# Patient Record
Sex: Male | Born: 1975 | Race: White | Hispanic: No | Marital: Married | Smoking: Never smoker
Health system: Southern US, Community
[De-identification: ages and names within clinical notes are randomized; demographics above are authoritative.]

## PROBLEM LIST (undated history)

## (undated) DIAGNOSIS — K219 Gastro-esophageal reflux disease without esophagitis: Secondary | ICD-10-CM

## (undated) DIAGNOSIS — T7840XA Allergy, unspecified, initial encounter: Secondary | ICD-10-CM

## (undated) HISTORY — PX: CHOLECYSTECTOMY: SHX55

## (undated) HISTORY — DX: Allergy, unspecified, initial encounter: T78.40XA

## (undated) HISTORY — PX: TONSILLECTOMY: SUR1361

---
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
## 2005-08-21 ENCOUNTER — Emergency Department: Payer: Self-pay | Admitting: Emergency Medicine

## 2007-06-25 ENCOUNTER — Emergency Department: Payer: Self-pay | Admitting: Emergency Medicine

## 2007-08-08 ENCOUNTER — Ambulatory Visit: Payer: Self-pay | Admitting: Specialist

## 2009-02-13 ENCOUNTER — Ambulatory Visit: Payer: Self-pay | Admitting: Family Medicine

## 2009-08-03 IMAGING — CT CT ABD-PELV W/O CM
1 of 2 series · 15 of 32 positions shown, 19 images · non-contrast
Comparison: none

REASON FOR EXAM: (1) L flank and LLQ pain; (2) L flank and LLQ pain
COMMENTS:

[Series 2: stone · axial · 0.82mm/px · z∈[-524,-56]mm · 15 of 172 slices shown, 19 images]
[im 8/172  soft-tissue]
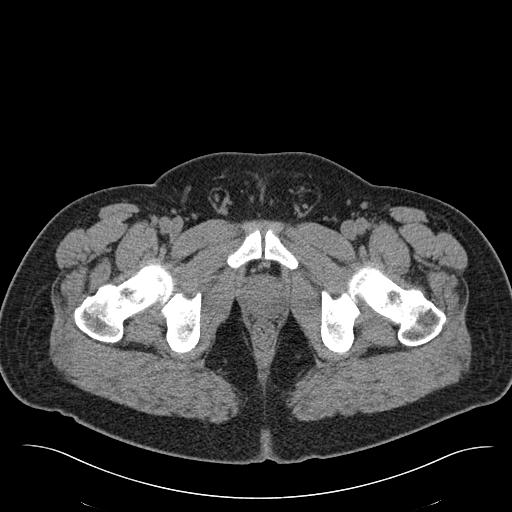
[im 8/172  bone]
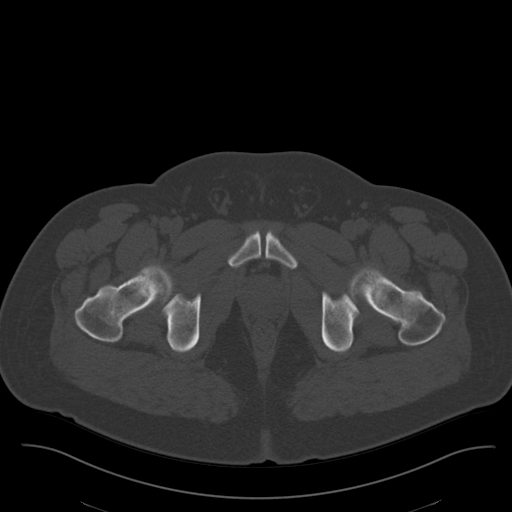
[im 23/172  soft-tissue]
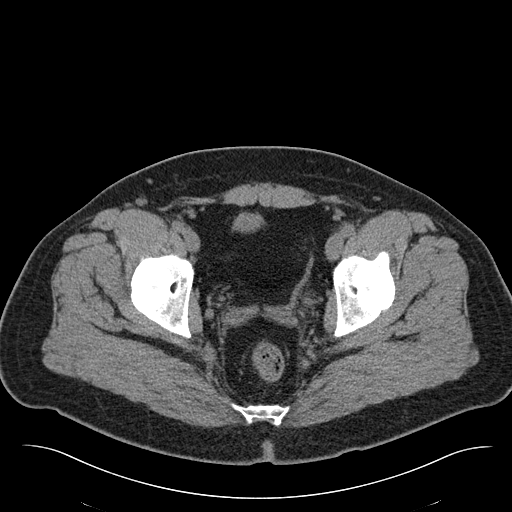
[im 38/172  soft-tissue]
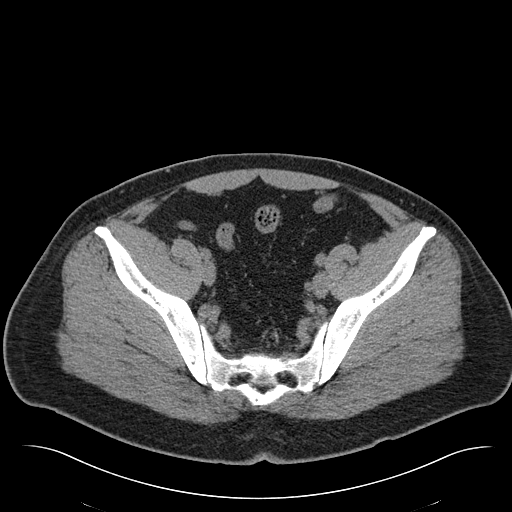
[im 45/172  soft-tissue]
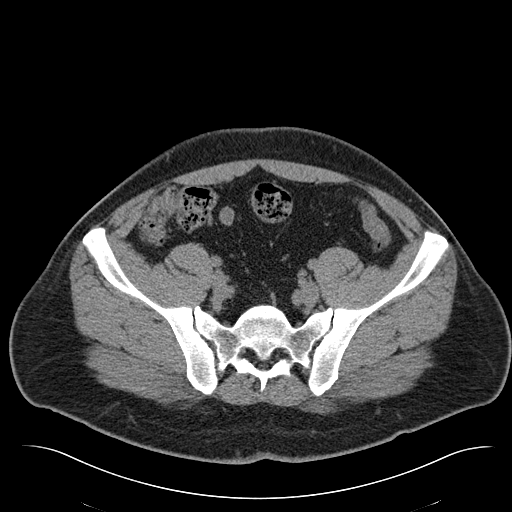
[im 60/172  soft-tissue]
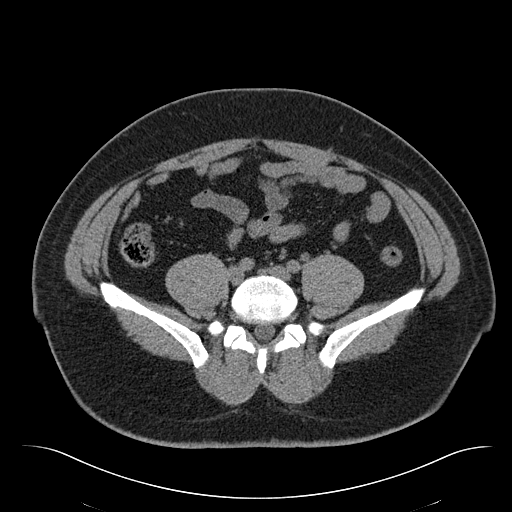
[im 75/172  soft-tissue]
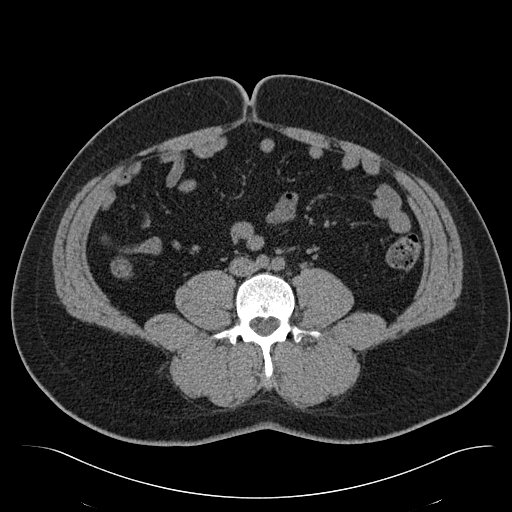
[im 90/172  soft-tissue]
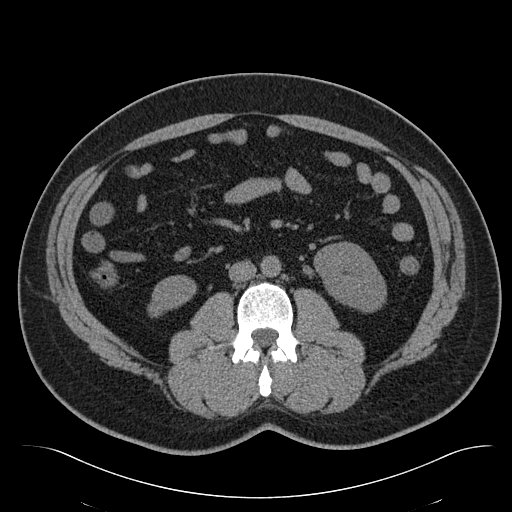
[im 97/172  soft-tissue]
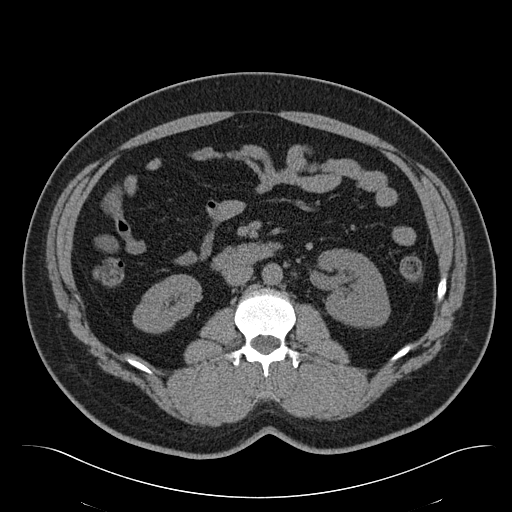
[im 112/172  soft-tissue]
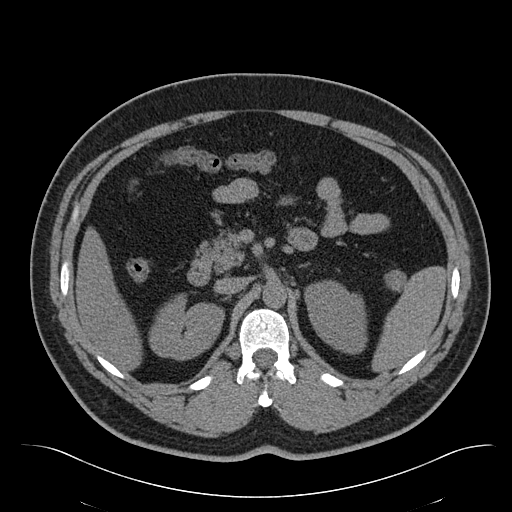
[im 112/172  bone]
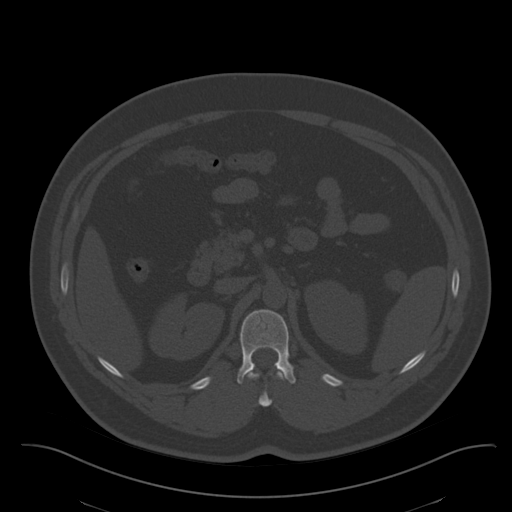
[im 127/172  soft-tissue]
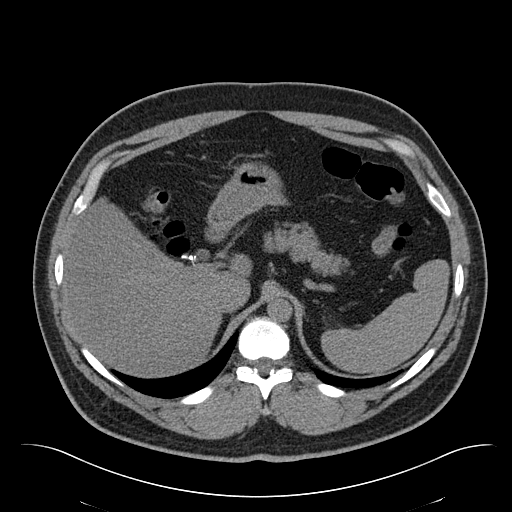
[im 134/172  soft-tissue]
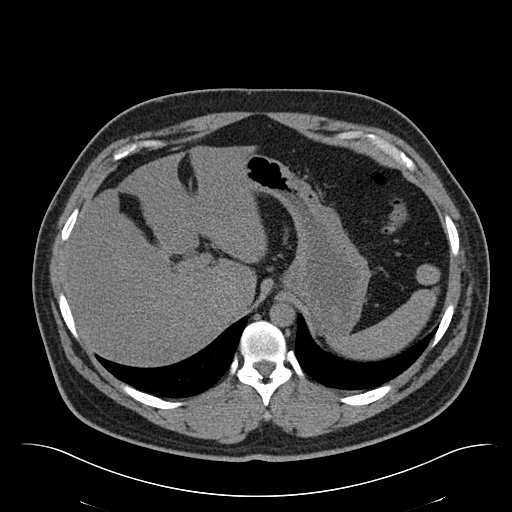
[im 142/172  lung]
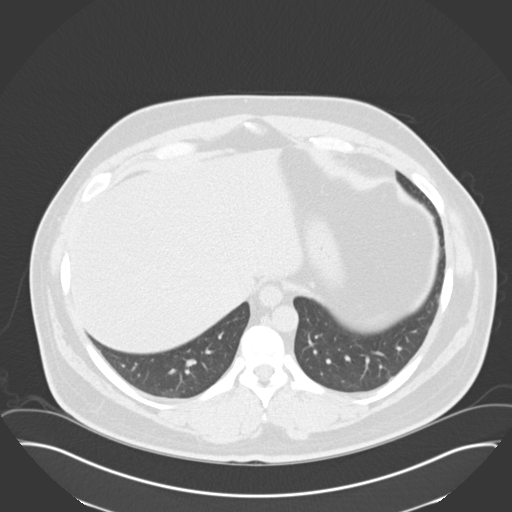
[im 149/172  soft-tissue]
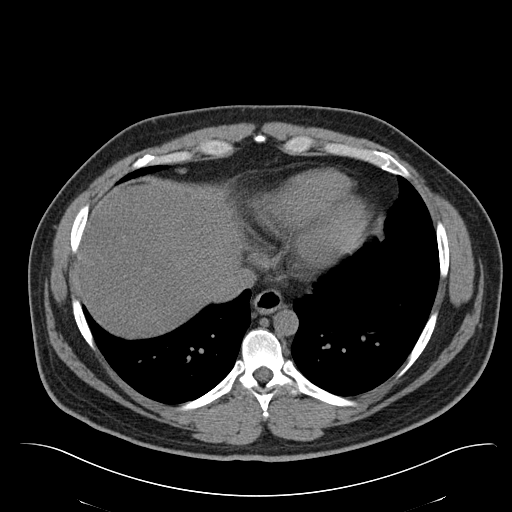
[im 149/172  lung]
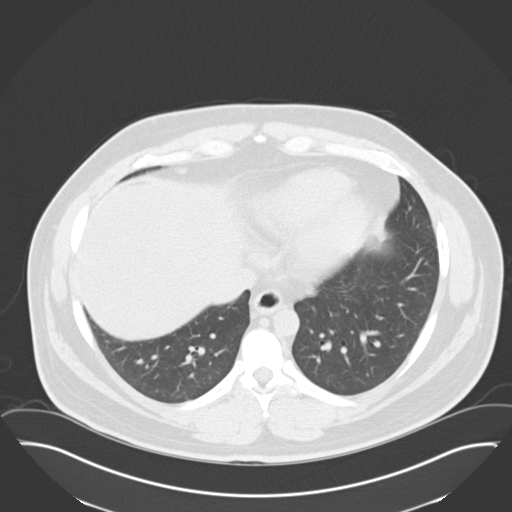
[im 157/172  lung]
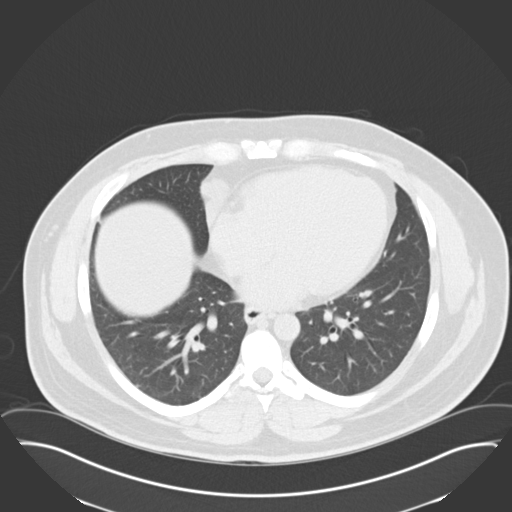
[im 164/172  soft-tissue]
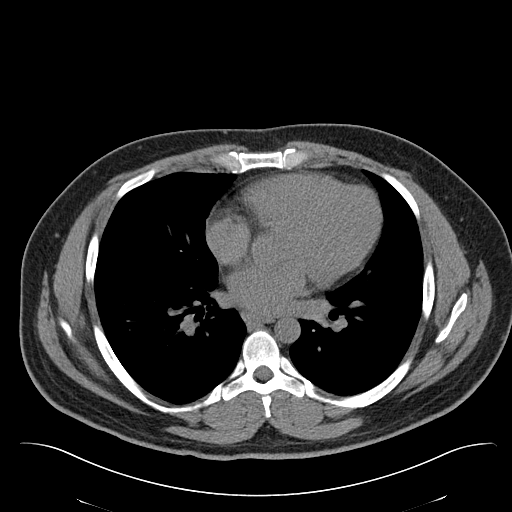
[im 164/172  lung]
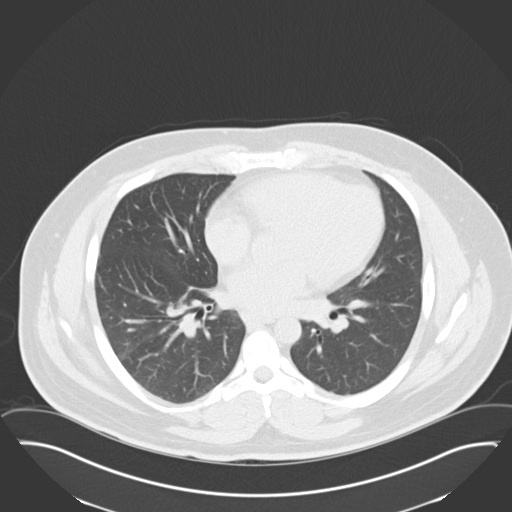

[15 of 32 positions shown; findings below may reference images not displayed]

PROCEDURE:     CT  - CT ABDOMEN AND PELVIS W[DATE]  [DATE]

RESULT:     Helical non-contrast 3 mm sections were obtained from the lung
bases through the pubic symphysis.

Evaluation of the lung bases demonstrates no gross abnormalities.

Within the limitations of a non-contrasted CT, the liver, spleen, adrenals
and pancreas are unremarkable. Evaluation of the LEFT kidney demonstrates a
1 mm fleck of calcified density projecting within the posterior
corticomedullary mid pole portion of the kidney. There is mild
pyelocaliectasis and mild hydroureter. Along the distal portion of the
ureter the hydroureter becomes moderate and there is inflammatory change
within the periureteral fat. In the region of the distal ureter just
proximal to the ureterovesical junction a 5.5 mm calculus is identified.

Evaluation of the RIGHT kidney demonstrates no evidence of hydronephrosis,
masses or calculi. The patient is status post cholecystectomy. There is no
evidence of an abdominal aortic aneurysm or abdominal or pelvic masses, free
fluid or drainable loculated fluid collections. No CT evidence is
appreciated reflecting the sequela of diverticulitis, bowel obstruction,
colitis, enteritis or appendicitis.
IMPRESSION: 1)Distal LEFT ureteral calculus measuring 5.5 mm in diameter with associated
mild obstructive uropathy.

2)Dr. Marinduque of the Emergency Room was informed of these findings at the
time of the initial interpretation.

## 2009-09-16 IMAGING — CT CT ABD-PELV W/O CM
1 of 2 series · 15 of 32 positions shown, 19 images · non-contrast
Comparison: none

REASON FOR EXAM: kidney stones
COMMENTS:

PROCEDURE:     CT  - CT ABDOMEN AND PELVIS W[DATE] [DATE]
RESULT:     Comparison: Renal stone CT on 06/25/2007.
TECHNIQUE: CT examination of the abdomen and pelvis was performed without
contrast. Collimation is 3 mm.

[Series 2: stone · axial · 0.86mm/px · z∈[-231,+231]mm · 15 of 169 slices shown, 19 images]
[im 8/169  soft-tissue]
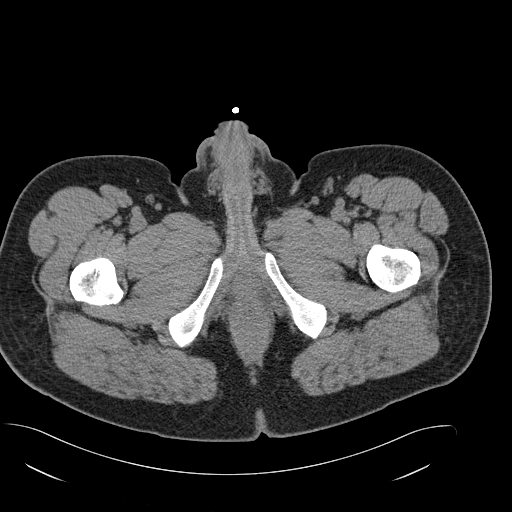
[im 8/169  bone]
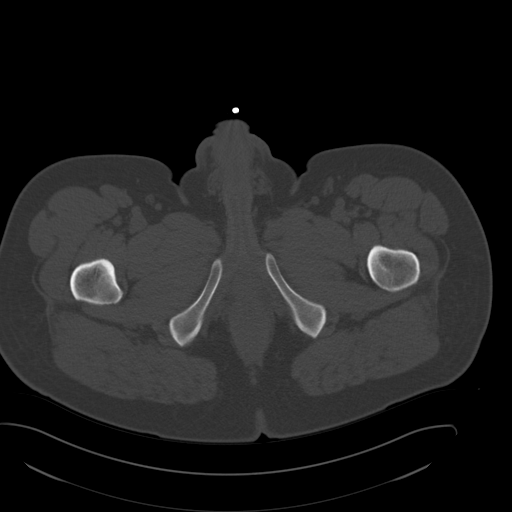
[im 22/169  soft-tissue]
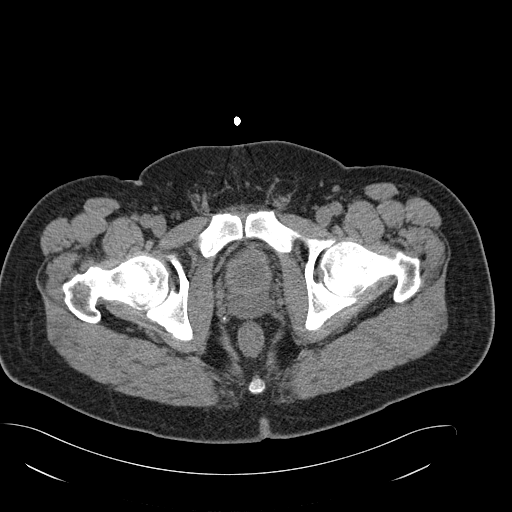
[im 36/169  soft-tissue]
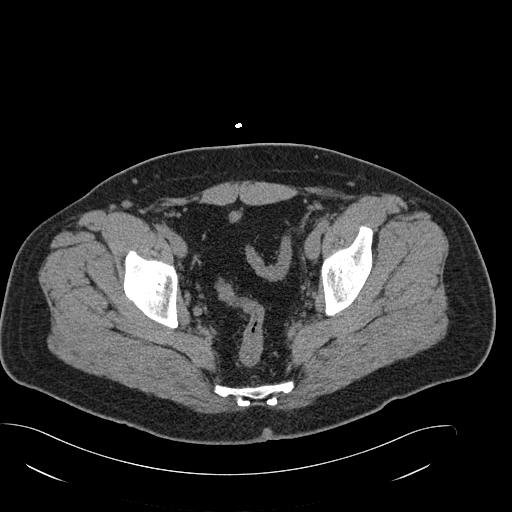
[im 50/169  soft-tissue]
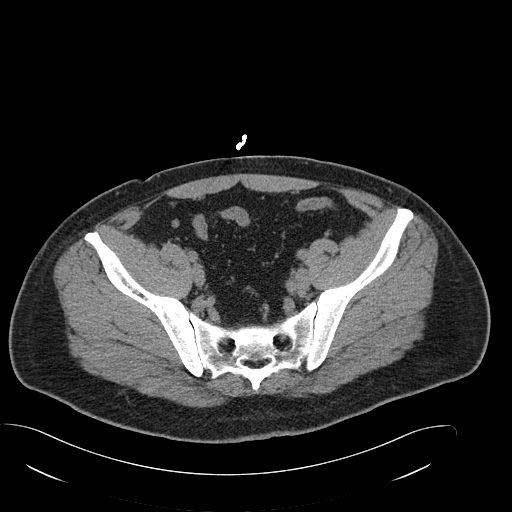
[im 57/169  soft-tissue]
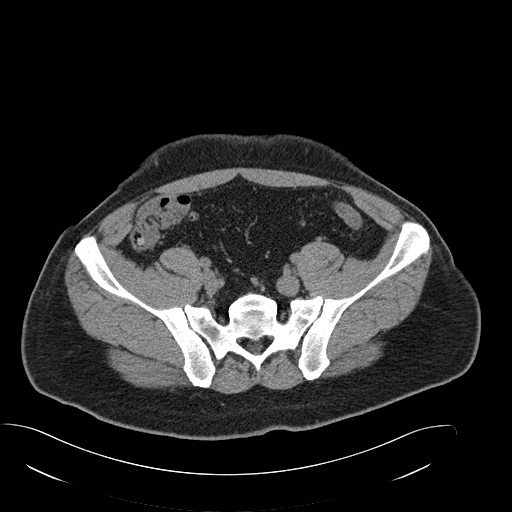
[im 71/169  soft-tissue]
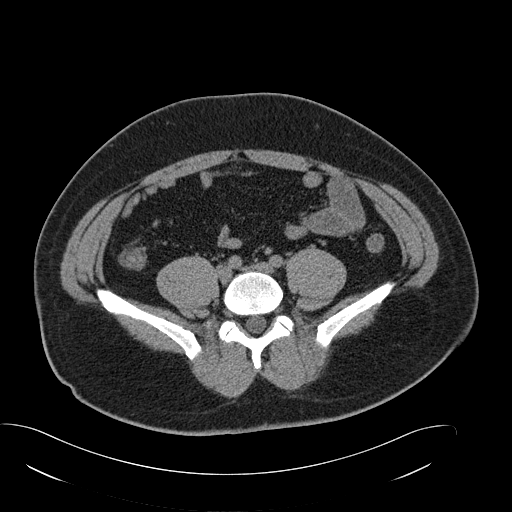
[im 85/169  soft-tissue]
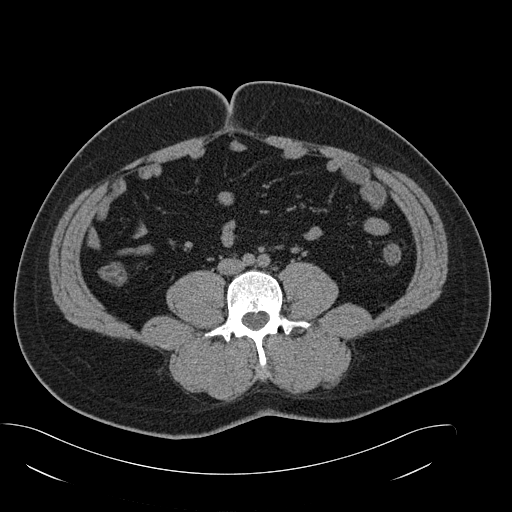
[im 99/169  soft-tissue]
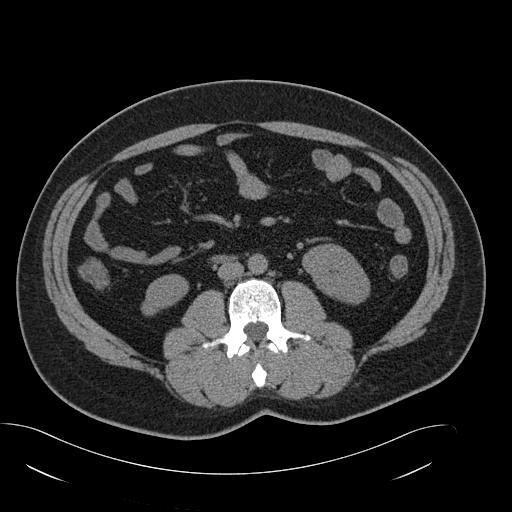
[im 113/169  soft-tissue]
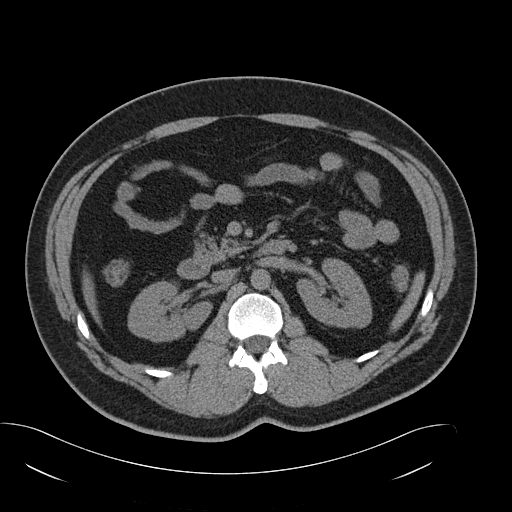
[im 113/169  bone]
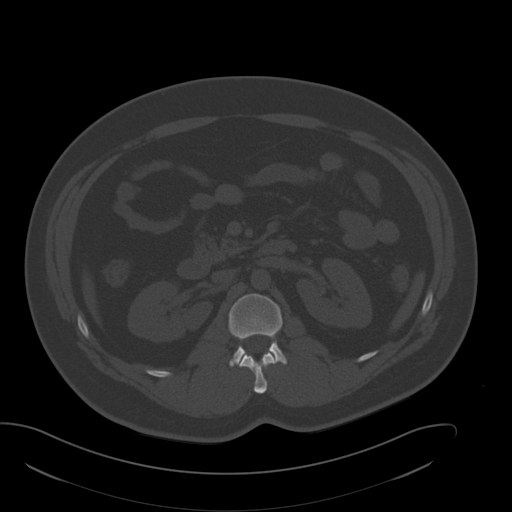
[im 120/169  soft-tissue]
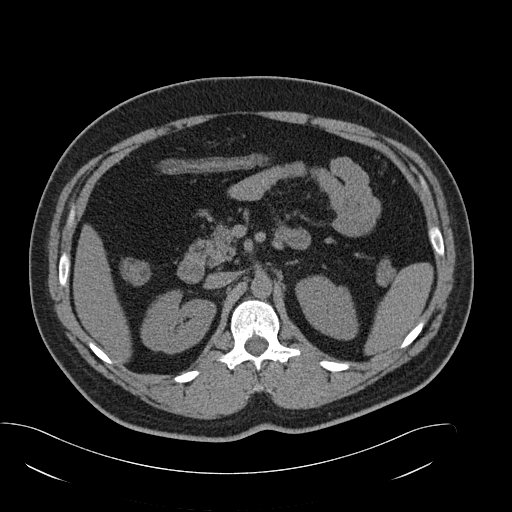
[im 134/169  soft-tissue]
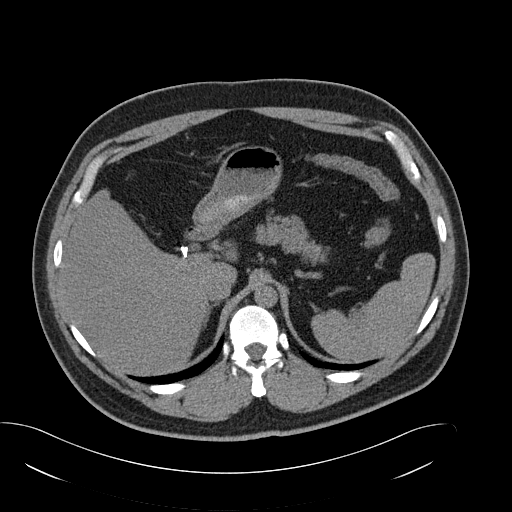
[im 141/169  lung]
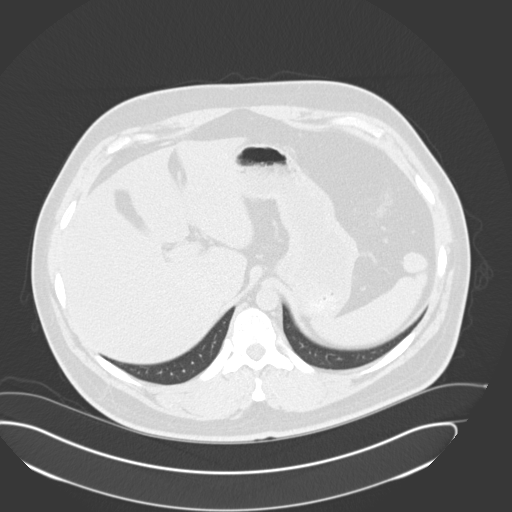
[im 148/169  soft-tissue]
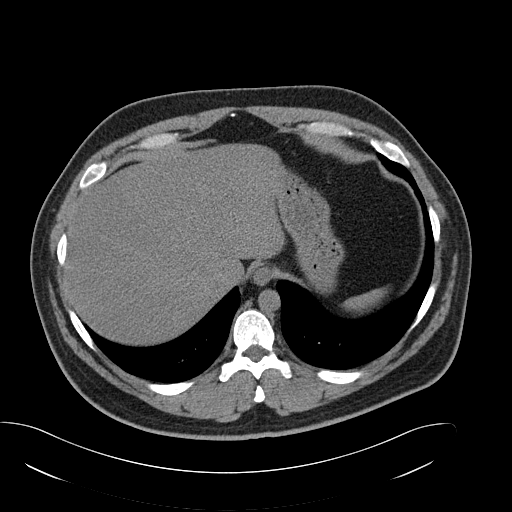
[im 148/169  lung]
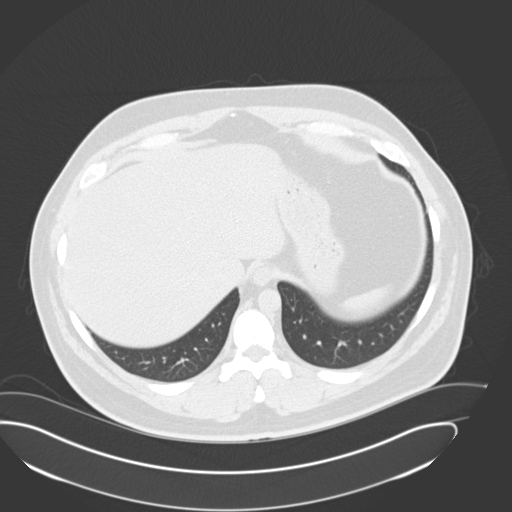
[im 155/169  lung]
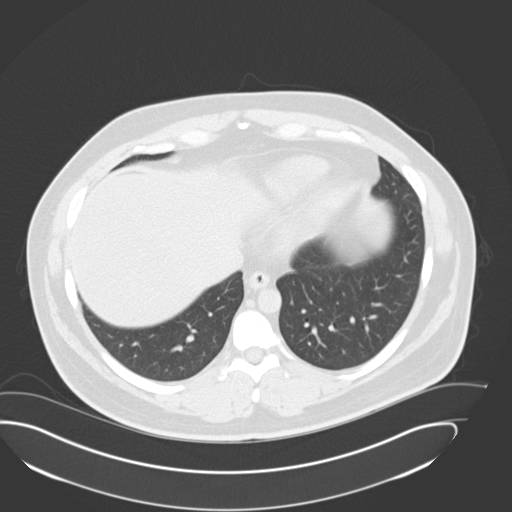
[im 162/169  soft-tissue]
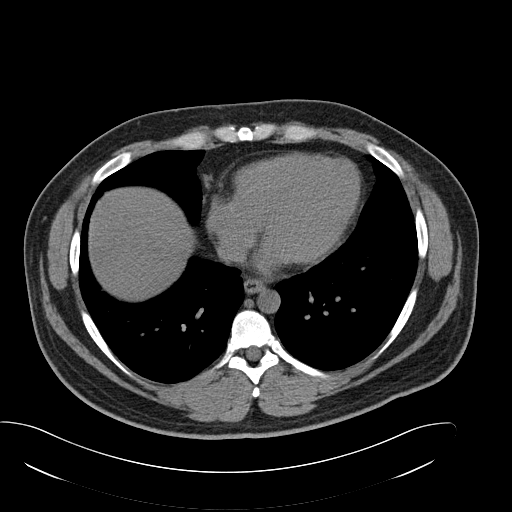
[im 162/169  lung]
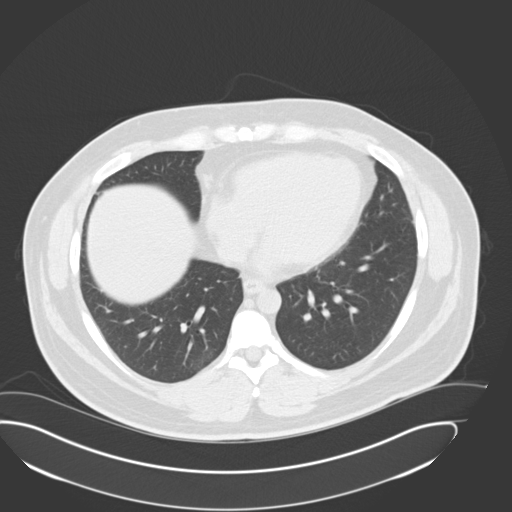

[15 of 32 positions shown; findings below may reference images not displayed]

FINDINGS: Limited evaluation of the lung bases is unremarkable

Evaluation of the abdominal organs, bowels, and vessels is limited without
contrast. The liver, spleen, pancreas, and adrenal glands are grossly
unremarkable. A small splenule is noted near the splenic hilum. Patient is
status post cholecystectomy. No renal or ureteral stone is noted. There is
no hydronephrosis.

There is no dilatation of the bowels. The appendix is unremarkable. There is
no intraperitoneal free air. There is no significant free fluid. There are
no enlarged abdominal pelvic lymph nodes.

Transitional vertebra is noted at the lumbosacral junction. It is labeled
S1. The posterior arch of S1 is not fully fused.
IMPRESSION: 1. No renal or ureteral stone is noted. There is no hydronephrosis.
Previously seen left UVJ stone has passed.

## 2011-03-17 ENCOUNTER — Ambulatory Visit: Payer: Self-pay | Admitting: Family Medicine

## 2011-05-25 ENCOUNTER — Ambulatory Visit: Payer: Self-pay | Admitting: Internal Medicine

## 2011-11-24 ENCOUNTER — Ambulatory Visit: Payer: Self-pay

## 2011-11-24 LAB — RAPID STREP-A WITH REFLX: Micro Text Report: NEGATIVE

## 2011-11-24 LAB — RAPID INFLUENZA A&B ANTIGENS

## 2011-11-26 LAB — BETA STREP CULTURE(ARMC)

## 2014-06-17 ENCOUNTER — Ambulatory Visit: Payer: Self-pay

## 2014-06-20 LAB — STOOL CULTURE

## 2014-07-30 LAB — CLOSTRIDIUM DIFFICILE(ARMC)

## 2015-12-03 ENCOUNTER — Ambulatory Visit: Payer: Self-pay | Admitting: Family Medicine

## 2017-11-23 ENCOUNTER — Other Ambulatory Visit: Payer: Self-pay

## 2017-11-23 ENCOUNTER — Encounter: Payer: Self-pay | Admitting: Emergency Medicine

## 2017-11-23 ENCOUNTER — Ambulatory Visit
Admission: EM | Admit: 2017-11-23 | Discharge: 2017-11-23 | Disposition: A | Discharge status: Home / Self Care | Payer: Commercial Managed Care - PPO | Attending: Family Medicine | Admitting: Family Medicine

## 2017-11-23 DIAGNOSIS — R0981 Nasal congestion: Secondary | ICD-10-CM

## 2017-11-23 DIAGNOSIS — J209 Acute bronchitis, unspecified: Secondary | ICD-10-CM

## 2017-11-23 DIAGNOSIS — R05 Cough: Secondary | ICD-10-CM

## 2017-11-23 LAB — RAPID STREP SCREEN (MED CTR MEBANE ONLY): Streptococcus, Group A Screen (Direct): NEGATIVE

## 2017-11-23 MED ORDER — PREDNISONE 50 MG PO TABS: ORAL_TABLET | ORAL | 0 refills | Status: DC

## 2017-11-23 MED ORDER — HYDROCOD POLST-CPM POLST ER 10-8 MG/5ML PO SUER: 5.0000 mL | Freq: Two times a day (BID) | ORAL | 0 refills | Status: DC | PRN

## 2017-11-23 NOTE — Discharge Instructions (Signed)
Medications as prescribed. ° °Take care ° °Dr. Veldon Wager  °

## 2017-11-23 NOTE — ED Triage Notes (Signed)
Patient c/o cough and chest congestion since Saturday.

## 2017-11-23 NOTE — ED Provider Notes (Signed)
[facility]    CSN: [id number] Arrival date & time: 11/23/17  1145  History   Chief Complaint Chief Complaint  Patient presents with  . Cough   HPI  42 year old male presents with cough and congestion.  Started Friday.  He reports his cough is nonproductive.  Associated congestion.  No fevers or chills.  No shortness of breath.  Has been taking Mucinex with some improvement.  However, his symptoms are worsened last night.  His cough became more severe and interfered with sleep.  No known exacerbating factors.  No other associated symptoms.  No other complaints at this time.  PMH - No reported medical problems.  Past Surgical History:  Procedure Laterality Date  . CHOLECYSTECTOMY    . TONSILLECTOMY     Home Medications    Prior to Admission medications   Medication Sig Start Date End Date Taking? Authorizing Provider  chlorpheniramine-HYDROcodone (TUSSIONEX PENNKINETIC ER) 10-8 MG/5ML SUER Take 5 mLs by mouth every 12 (twelve) hours as needed. 11/23/17   Coral Spikes, DO  predniSONE (DELTASONE) 50 MG tablet 1 tablet daily x 5 days. 11/23/17   Coral Spikes, DO   Family History No reported family hx.  Social History Social History   Tobacco Use  . Smoking status: Never Smoker  . Smokeless tobacco: Never Used  Substance Use Topics  . Alcohol use: No    Frequency: Never  . Drug use: No   Allergies   Patient has no known allergies.  Review of Systems Review of Systems  Constitutional: Negative for chills and fever.  Respiratory: Positive for cough.   All other systems reviewed and are negative.  Physical Exam Triage Vital Signs ED Triage Vitals  Enc Vitals Group     BP 11/23/17 1159 137/80     Pulse Rate 11/23/17 1159 (!) 116     Resp 11/23/17 1159 16     Temp 11/23/17 1159 98.4 F (36.9 C)     Temp Source 11/23/17 1159 Oral     SpO2 11/23/17 1159 97 %     Weight 11/23/17 1158 230 lb (104.3 kg)     Height 11/23/17 1158 6' (1.829 m)     Head  Circumference --      Peak Flow --      Pain Score 11/23/17 1232 0     Pain Loc --      Pain Edu? --      Excl. in [place]? --    No data found.  Updated Vital Signs BP 137/80 (BP Location: Left Arm)   Pulse (!) 116   Temp 98.4 F (36.9 C) (Oral)   Resp 16   Ht 6' (1.829 m)   Wt 230 lb (104.3 kg)   SpO2 97%   BMI 31.19 kg/m   Physical Exam  Constitutional: He is oriented to person, place, and time. He appears well-developed. No distress.  HENT:  Head: Normocephalic and atraumatic.  Mouth/Throat: Oropharynx is clear and moist.  Eyes: Conjunctivae are normal. Right eye exhibits no discharge. Left eye exhibits no discharge.  Neck: Neck supple.  Cardiovascular: Normal rate and regular rhythm.  No murmur heard. Pulmonary/Chest: Effort normal and breath sounds normal. He has no wheezes. He has no rales.  Lymphadenopathy:    He has no cervical adenopathy.  Neurological: He is alert and oriented to person, place, and time.  Psychiatric: He has a normal mood and affect. His behavior is normal.  Nursing note and vitals reviewed.  UC  Treatments / Results  Labs (all labs ordered are listed, but only abnormal results are displayed) Labs Reviewed  RAPID STREP SCREEN (NOT AT [facility])  CULTURE, GROUP A STREP [facility])    EKG  EKG Interpretation None       Radiology No results found.  Procedures Procedures (including critical care time)  Medications Ordered in UC Medications - No data to display   Initial Impression / Assessment and Plan / UC Course  I have reviewed the triage vital signs and the nursing notes.  Pertinent labs & imaging results that were available during my care of the patient were reviewed by me and considered in my medical decision making (see chart for details).     42 year old male presents with acute bronchitis.  Treating with prednisone and Tussionex.  Final Clinical Impressions(s) / UC Diagnoses   Final diagnoses:  Acute bronchitis, unspecified  organism    ED Discharge Orders        Ordered    predniSONE (DELTASONE) 50 MG tablet     11/23/17 1228    chlorpheniramine-HYDROcodone (TUSSIONEX PENNKINETIC ER) 10-8 MG/5ML SUER  Every 12 hours PRN     11/23/17 1228     Controlled Substance Prescriptions [place] Controlled Substance Registry consulted? No   Coral Spikes, [place] 11/23/17 1242

## 2017-11-26 LAB — CULTURE, GROUP A STREP (THRC)

## 2022-03-15 ENCOUNTER — Encounter: Payer: Self-pay | Admitting: Physician Assistant

## 2022-03-15 ENCOUNTER — Ambulatory Visit (INDEPENDENT_AMBULATORY_CARE_PROVIDER_SITE_OTHER): Payer: Commercial Managed Care - PPO | Admitting: Physician Assistant

## 2022-03-15 VITALS — BP 138/90 | HR 104 | Ht 72.0 in | Wt 264.2 lb

## 2022-03-15 DIAGNOSIS — Z1211 Encounter for screening for malignant neoplasm of colon: Secondary | ICD-10-CM

## 2022-03-15 DIAGNOSIS — Z0000 Encounter for general adult medical examination without abnormal findings: Secondary | ICD-10-CM

## 2022-03-15 DIAGNOSIS — J309 Allergic rhinitis, unspecified: Secondary | ICD-10-CM

## 2022-03-15 DIAGNOSIS — Z114 Encounter for screening for human immunodeficiency virus [HIV]: Secondary | ICD-10-CM

## 2022-03-15 DIAGNOSIS — Z1159 Encounter for screening for other viral diseases: Secondary | ICD-10-CM

## 2022-03-15 DIAGNOSIS — Z23 Encounter for immunization: Secondary | ICD-10-CM

## 2022-03-15 DIAGNOSIS — Z800 Family history of malignant neoplasm of digestive organs: Secondary | ICD-10-CM

## 2022-03-15 MED ORDER — FLUTICASONE PROPIONATE 50 MCG/ACT NA SUSP: 2.0000 | Freq: Every day | NASAL | 6 refills | Status: DC

## 2022-03-15 NOTE — Progress Notes (Signed)
? ? ?I,Sha'taria Tyson,acting as a Education administrator for [employer], PA-C.,have documented all relevant documentation on the behalf of Ronald Kirschner, PA-C,as directed by  Ronald Kirschner, PA-C while in the presence of Ronald Kirschner, PA-C. ? ?New patient visit ? ? ?Patient: Ronald Ortega   DOB: 1976/07/18   46 y.o. Male  MRN: [id number] ?Visit Date: 03/15/2022 ? ?Today's healthcare provider: Mikey Kirschner, PA-C  ? ?Cc. New pt establish care ? ?Subjective  ?  ?Ronald Ortega is a 46 y.o. male who presents today as a new patient to establish care.  ?HPI  ?Seasonal allergies ?-congestion, runny nose. Manages w/ zyrtec, xclear nasal spray.  ? ?Family history colon cancer ?-father in early - mid 54s. Pt has never done a colonoscopy ? ?Past Medical History:  ?Diagnosis Date  ? Allergy   ? ?Past Surgical History:  ?Procedure Laterality Date  ? CHOLECYSTECTOMY  1994  ? Reconstructive Tendons Right 1990  ? TONSILLECTOMY  1980  ? ?Family Status  ?Relation Name Status  ? Mother  (Not Specified)  ? Father  (Not Specified)  ? ?Family History  ?Problem Relation Age of Onset  ? Hypertension Mother   ? Colon cancer Father   ? ?Social History  ? ?Socioeconomic History  ? Marital status: Married  ?  Spouse name: Not on file  ? Number of children: Not on file  ? Years of education: Not on file  ? Highest education level: Not on file  ?Occupational History  ? Not on file  ?Tobacco Use  ? Smoking status: Never  ? Smokeless tobacco: Never  ?Vaping Use  ? Vaping Use: Never used  ?Substance and Sexual Activity  ? Alcohol use: No  ? Drug use: No  ? Sexual activity: Not on file  ?Other Topics Concern  ? Not on file  ?Social History Narrative  ? Not on file  ? ?Social Determinants of Health  ? ?Financial Resource Strain: Not on file  ?Food Insecurity: Not on file  ?Transportation Needs: Not on file  ?Physical Activity: Not on file  ?Stress: Not on file  ?Social Connections: Not on file  ? ?Outpatient Medications Prior to Visit   ?Medication Sig  ? Cetirizine HCl (ZYRTEC PO) Take by mouth.  ? [DISCONTINUED] chlorpheniramine-HYDROcodone (TUSSIONEX PENNKINETIC ER) 10-8 MG/5ML SUER Take 5 mLs by mouth every 12 (twelve) hours as needed. (Patient not taking: Reported on 03/15/2022)  ? [DISCONTINUED] predniSONE (DELTASONE) 50 MG tablet 1 tablet daily x 5 days. (Patient not taking: Reported on 03/15/2022)  ? ?No facility-administered medications prior to visit.  ? ?No Known Allergies ? ?Immunization History  ?Administered Date(s) Administered  ? PFIZER Comirnaty(Gray Top)Covid-19 Tri-Sucrose Vaccine 07/22/2020, 08/12/2020  ? Td 03/15/2022  ? ? ?Health Maintenance  ?Topic Date Due  ? Hepatitis C Screening  Never done  ? COLONOSCOPY (Pts 45-24yr Insurance coverage will need to be confirmed)  Never done  ? COVID-19 Vaccine (3 - Booster for Pfizer series) 10/07/2020  ? INFLUENZA VACCINE  06/22/2022  ? TETANUS/TDAP  03/15/2032  ? HIV Screening  Completed  ? HPV VACCINES  Aged Out  ? ? ?Patient Care Team: ?DEmelia Loronas PCP - General (Physician Assistant) ? ?Review of Systems  ?Constitutional:  Positive for activity change.  ?HENT:  Positive for congestion.   ?Gastrointestinal:  Positive for constipation and diarrhea.  ?Musculoskeletal:  Positive for arthralgias.  ?Allergic/Immunologic: Positive for environmental allergies.  ?Neurological:  Positive for headaches.  ? ? ? ? Objective  ?  ?  Blood pressure 138/90, pulse (!) 104, height 6' (1.829 m), weight 264 lb 3.2 oz (119.8 kg), SpO2 100 %.  ? ?Physical Exam ?Constitutional:   ?   General: He is awake.  ?   Appearance: He is well-developed.  ?HENT:  ?   Head: Normocephalic.  ?Eyes:  ?   Conjunctiva/sclera: Conjunctivae normal.  ?Cardiovascular:  ?   Rate and Rhythm: Normal rate and regular rhythm.  ?   Heart sounds: Normal heart sounds.  ?Pulmonary:  ?   Effort: Pulmonary effort is normal.  ?   Breath sounds: Normal breath sounds.  ?Musculoskeletal:  ?   Right lower leg: No edema.  ?   Left  lower leg: No edema.  ?Skin: ?   General: Skin is warm.  ?Neurological:  ?   Mental Status: He is alert and oriented to person, place, and time.  ?Psychiatric:     ?   Attention and Perception: Attention normal.     ?   Mood and Affect: Mood normal.     ?   Speech: Speech normal.     ?   Behavior: Behavior is cooperative.  ? ? ?Depression Screen ? ?  03/15/2022  ?  2:06 PM  ?PHQ 2/9 Scores  ?PHQ - 2 Score 1  ?PHQ- 9 Score 4  ? ?No results found for any visits on 03/15/22. ? Assessment & Plan   ?  ? ?Problem List Items Addressed This Visit   ? ?  ? Respiratory  ? Allergic rhinitis  ?  Can add flonase nasal spray ? ?  ?  ? Relevant Medications  ? fluticasone (FLONASE) 50 MCG/ACT nasal spray  ? ?Other Visit Diagnoses   ? ? Encounter for medical examination to establish care    -  Primary  ? Relevant Orders  ? Lipid panel  ? HgB A1c  ? Comprehensive metabolic panel  ? CBC  ? TSH + free T4  ? Encounter for screening for malignant neoplasm of colon      ? Relevant Orders  ? Ambulatory referral to Gastroenterology  ? Need for tetanus booster      ? Encounter for screening for HIV      ? Relevant Orders  ? HIV antibody (with reflex)  ? Encounter for hepatitis C screening test for low risk patient      ? Relevant Orders  ? Hepatitis C antibody  ? Need for vaccine for Td (tetanus-diphtheria)      ? Relevant Orders  ? Td : Tetanus/diphtheria >7yo Preservative  free (Completed)  ? ?  ?  ?Will determine need for f/u based on bloodwork ? ?I, Ronald Kirschner, PA-C have reviewed all documentation for this visit. The documentation on  03/15/2022 for the exam, diagnosis, procedures, and orders are all accurate and complete. ? ?Ronald Kirschner, PA-C ?[place] ?[id number] ?[place], [place], [address] ?Office: [phone] ?Fax: [phone]  ? ?[facility] ? ?

## 2022-03-15 NOTE — Assessment & Plan Note (Signed)
Can add flonase nasal spray ?

## 2022-03-16 ENCOUNTER — Telehealth: Payer: Self-pay

## 2022-03-16 ENCOUNTER — Other Ambulatory Visit: Payer: Self-pay

## 2022-03-16 DIAGNOSIS — Z800 Family history of malignant neoplasm of digestive organs: Secondary | ICD-10-CM

## 2022-03-16 DIAGNOSIS — Z1211 Encounter for screening for malignant neoplasm of colon: Secondary | ICD-10-CM

## 2022-03-16 LAB — HEMOGLOBIN A1C
Est. average glucose Bld gHb Est-mCnc: 128 mg/dL
Hgb A1c MFr Bld: 6.1 % — ABNORMAL HIGH (ref 4.8–5.6)

## 2022-03-16 LAB — CBC
Hematocrit: 43.1 % (ref 37.5–51.0)
Hemoglobin: 15.2 g/dL (ref 13.0–17.7)
MCH: 31.9 pg (ref 26.6–33.0)
MCHC: 35.3 g/dL (ref 31.5–35.7)
MCV: 91 fL (ref 79–97)
Platelets: 205 10*3/uL (ref 150–450)
RBC: 4.76 x10E6/uL (ref 4.14–5.80)
RDW: 12.4 % (ref 11.6–15.4)
WBC: 8.7 10*3/uL (ref 3.4–10.8)

## 2022-03-16 LAB — HIV ANTIBODY (ROUTINE TESTING W REFLEX): HIV Screen 4th Generation wRfx: NONREACTIVE

## 2022-03-16 LAB — COMPREHENSIVE METABOLIC PANEL
ALT: 53 IU/L — ABNORMAL HIGH (ref 0–44)
AST: 31 IU/L (ref 0–40)
Albumin/Globulin Ratio: 1.7 (ref 1.2–2.2)
Albumin: 4.7 g/dL (ref 4.0–5.0)
Alkaline Phosphatase: 130 IU/L — ABNORMAL HIGH (ref 44–121)
BUN/Creatinine Ratio: 12 (ref 9–20)
BUN: 10 mg/dL (ref 6–24)
Bilirubin Total: 0.4 mg/dL (ref 0.0–1.2)
CO2: 24 mmol/L (ref 20–29)
Calcium: 9.7 mg/dL (ref 8.7–10.2)
Chloride: 100 mmol/L (ref 96–106)
Creatinine, Ser: 0.85 mg/dL (ref 0.76–1.27)
Globulin, Total: 2.7 g/dL (ref 1.5–4.5)
Glucose: 105 mg/dL — ABNORMAL HIGH (ref 70–99)
Potassium: 4 mmol/L (ref 3.5–5.2)
Sodium: 138 mmol/L (ref 134–144)
Total Protein: 7.4 g/dL (ref 6.0–8.5)
eGFR: 107 mL/min/{1.73_m2} (ref >59)

## 2022-03-16 LAB — TSH+FREE T4
Free T4: 1.15 ng/dL (ref 0.82–1.77)
TSH: 2.16 u[IU]/mL (ref 0.450–4.500)

## 2022-03-16 LAB — LIPID PANEL
Chol/HDL Ratio: 4 ratio (ref 0.0–5.0)
Cholesterol, Total: 167 mg/dL (ref 100–199)
HDL: 42 mg/dL (ref >39)
LDL Chol Calc (NIH): 106 mg/dL — ABNORMAL HIGH (ref 0–99)
Triglycerides: 106 mg/dL (ref 0–149)
VLDL Cholesterol Cal: 19 mg/dL (ref 5–40)

## 2022-03-16 LAB — HEPATITIS C ANTIBODY: Hep C Virus Ab: NONREACTIVE

## 2022-03-16 MED ORDER — NA SULFATE-K SULFATE-MG SULF 17.5-3.13-1.6 GM/177ML PO SOLN: 1.0000 | Freq: Once | ORAL | 0 refills | Status: AC

## 2022-03-16 NOTE — Telephone Encounter (Signed)
Gastroenterology Pre-Procedure Review ? ?Request Date: Thursday 04/08/22 ?Requesting Physician: Dr. Marius Ditch ? ?PATIENT REVIEW QUESTIONS: The patient has given verbal permission for his wife to complete  the following health history questions as indicated:   ? ?1. Are you having any GI issues?  Off and on constipation and diarrhea wife said its related to not having his gallbladder ?2. Do you have a personal history of Polyps? no ?3. Do you have a family history of Colon Cancer or Polyps? yes (father colon cancer, mother polyps) ?4. Diabetes Mellitus? no ?5. Joint replacements in the past 12 months?no ?6. Major health problems in the past 3 months?no ?7. Any artificial heart valves, MVP, or defibrillator?no ?   ?MEDICATIONS & ALLERGIES:    ?Patient reports the following regarding taking any anticoagulation/antiplatelet therapy:   ?Plavix, Coumadin, Eliquis, Xarelto, Lovenox, Pradaxa, Brilinta, or Effient? no ?Aspirin? no ? ?Patient confirms/reports the following medications:  ?Current Outpatient Medications  ?Medication Sig Dispense Refill  ? Cetirizine HCl (ZYRTEC PO) Take by mouth.    ? fluticasone (FLONASE) 50 MCG/ACT nasal spray Place 2 sprays into both nostrils daily. 16 g 6  ? ?No current facility-administered medications for this visit.  ? ? ?Patient confirms/reports the following allergies:  ?No Known Allergies ? ?No orders of the defined types were placed in this encounter. ? ? ?AUTHORIZATION INFORMATION ?Primary Insurance: ?1D#: ?Group #: ? ?Secondary Insurance: ?1D#: ?Group #: ? ?SCHEDULE INFORMATION: ?Date: Thursday 04/08/22 ?Time: ?Location: [facility] ?

## 2022-03-29 ENCOUNTER — Encounter: Payer: Self-pay | Admitting: Gastroenterology

## 2022-03-30 ENCOUNTER — Encounter: Payer: Self-pay | Admitting: Gastroenterology

## 2022-04-08 ENCOUNTER — Encounter: Payer: Self-pay | Admitting: Gastroenterology

## 2022-04-08 ENCOUNTER — Encounter
Admission: RE | Disposition: A | Discharge status: Home / Self Care | Payer: Self-pay | Source: Home / Self Care | Attending: Gastroenterology

## 2022-04-08 ENCOUNTER — Ambulatory Visit: Payer: Commercial Managed Care - PPO | Admitting: Anesthesiology

## 2022-04-08 ENCOUNTER — Other Ambulatory Visit: Payer: Self-pay

## 2022-04-08 ENCOUNTER — Ambulatory Visit
Admission: RE | Admit: 2022-04-08 | Discharge: 2022-04-08 | Disposition: A | Discharge status: Home / Self Care | Payer: Commercial Managed Care - PPO | Attending: Gastroenterology | Admitting: Gastroenterology

## 2022-04-08 DIAGNOSIS — K635 Polyp of colon: Secondary | ICD-10-CM | POA: Insufficient documentation

## 2022-04-08 DIAGNOSIS — Z1211 Encounter for screening for malignant neoplasm of colon: Secondary | ICD-10-CM | POA: Insufficient documentation

## 2022-04-08 DIAGNOSIS — Z800 Family history of malignant neoplasm of digestive organs: Secondary | ICD-10-CM | POA: Diagnosis not present

## 2022-04-08 DIAGNOSIS — K644 Residual hemorrhoidal skin tags: Secondary | ICD-10-CM | POA: Insufficient documentation

## 2022-04-08 DIAGNOSIS — K621 Rectal polyp: Secondary | ICD-10-CM | POA: Diagnosis not present

## 2022-04-08 DIAGNOSIS — D123 Benign neoplasm of transverse colon: Secondary | ICD-10-CM | POA: Insufficient documentation

## 2022-04-08 DIAGNOSIS — D126 Benign neoplasm of colon, unspecified: Secondary | ICD-10-CM | POA: Diagnosis not present

## 2022-04-08 HISTORY — PX: POLYPECTOMY: SHX5525

## 2022-04-08 HISTORY — DX: Gastro-esophageal reflux disease without esophagitis: K21.9

## 2022-04-08 HISTORY — PX: COLONOSCOPY WITH PROPOFOL: SHX5780

## 2022-04-08 SURGERY — COLONOSCOPY WITH PROPOFOL
Anesthesia: General | Site: Rectum

## 2022-04-08 MED ORDER — LIDOCAINE HCL (CARDIAC) PF 100 MG/5ML IV SOSY
PREFILLED_SYRINGE | INTRAVENOUS | Status: DC | PRN
  Administered 2022-04-08: 40 mg via INTRAVENOUS

## 2022-04-08 MED ORDER — ONDANSETRON HCL 4 MG/2ML IJ SOLN: 4.0000 mg | Freq: Once | INTRAMUSCULAR | Status: DC | PRN

## 2022-04-08 MED ORDER — ACETAMINOPHEN 160 MG/5ML PO SOLN: 975.0000 mg | Freq: Once | ORAL | Status: DC | PRN

## 2022-04-08 MED ORDER — STERILE WATER FOR IRRIGATION IR SOLN
Status: DC | PRN
  Administered 2022-04-08: 150 mL

## 2022-04-08 MED ORDER — LACTATED RINGERS IV SOLN: INTRAVENOUS | Status: DC

## 2022-04-08 MED ORDER — STERILE WATER FOR IRRIGATION IR SOLN
Status: DC | PRN
  Administered 2022-04-08: 60 mL

## 2022-04-08 MED ORDER — ACETAMINOPHEN 500 MG PO TABS: 1000.0000 mg | ORAL_TABLET | Freq: Once | ORAL | Status: DC | PRN

## 2022-04-08 MED ORDER — SODIUM CHLORIDE 0.9 % IV SOLN: INTRAVENOUS | Status: DC

## 2022-04-08 MED ORDER — PROPOFOL 10 MG/ML IV BOLUS
INTRAVENOUS | Status: DC | PRN
Start: 2022-04-08 — End: 2022-04-08
  Administered 2022-04-08 (×5): 50 mg via INTRAVENOUS
  Administered 2022-04-08: 40 mg via INTRAVENOUS
  Administered 2022-04-08: 50 mg via INTRAVENOUS
  Administered 2022-04-08: 30 mg via INTRAVENOUS
  Administered 2022-04-08: 40 mg via INTRAVENOUS

## 2022-04-08 SURGICAL SUPPLY — 26 items
CLIP HMST 235XBRD CATH ROT (MISCELLANEOUS) IMPLANT
CLIP RESOLUTION 360 11X235 (MISCELLANEOUS)
ELECT REM PT RETURN 9FT ADLT (ELECTROSURGICAL)
ELECTRODE REM PT RTRN 9FT ADLT (ELECTROSURGICAL) IMPLANT
FCP ESCP3.2XJMB 240X2.8X (MISCELLANEOUS)
FORCEPS BIOP RAD 4 LRG CAP 4 (CUTTING FORCEPS) IMPLANT
FORCEPS BIOP RJ4 240 W/NDL (MISCELLANEOUS)
FORCEPS ESCP3.2XJMB 240X2.8X (MISCELLANEOUS) IMPLANT
GOWN CVR UNV OPN BCK APRN NK (MISCELLANEOUS) ×4 IMPLANT
GOWN ISOL THUMB LOOP REG UNIV (MISCELLANEOUS) ×6
INJECTOR VARIJECT VIN23 (MISCELLANEOUS) IMPLANT
KIT DEFENDO VALVE AND CONN (KITS) IMPLANT
KIT PRC NS LF DISP ENDO (KITS) ×2 IMPLANT
KIT PROCEDURE OLYMPUS (KITS) ×3
MANIFOLD NEPTUNE II (INSTRUMENTS) ×3 IMPLANT
MARKER SPOT ENDO TATTOO 5ML (MISCELLANEOUS) IMPLANT
PROBE APC STR FIRE (PROBE) IMPLANT
RETRIEVER NET ROTH 2.5X230 LF (MISCELLANEOUS) IMPLANT
SNARE COLD EXACTO (MISCELLANEOUS) ×1 IMPLANT
SNARE SHORT THROW 13M SML OVAL (MISCELLANEOUS) IMPLANT
SNARE SHORT THROW 30M LRG OVAL (MISCELLANEOUS) IMPLANT
SNARE SNG USE RND 15MM (INSTRUMENTS) IMPLANT
SPOT EX ENDOSCOPIC TATTOO (MISCELLANEOUS)
TRAP ETRAP POLY (MISCELLANEOUS) ×1 IMPLANT
VARIJECT INJECTOR VIN23 (MISCELLANEOUS)
WATER STERILE IRR 250ML POUR (IV SOLUTION) ×3 IMPLANT

## 2022-04-08 NOTE — Transfer of Care (Signed)
Immediate Anesthesia Transfer of Care Note  Patient: Ronald Ortega  Procedure(s) Performed: COLONOSCOPY WITH PROPOFOL (Rectum) POLYPECTOMY (Rectum)  Patient Location: PACU  Anesthesia Type: General  Level of Consciousness: awake, alert  and patient cooperative  Airway and Oxygen Therapy: Patient Spontanous Breathing and Patient connected to supplemental oxygen  Post-op Assessment: Post-op Vital signs reviewed, Patient's Cardiovascular Status Stable, Respiratory Function Stable, Patent Airway and No signs of Nausea or vomiting  Post-op Vital Signs: Reviewed and stable  Complications: No notable events documented.

## 2022-04-08 NOTE — Anesthesia Postprocedure Evaluation (Signed)
Anesthesia Post Note  Patient: Ronald Ortega  Procedure(s) Performed: COLONOSCOPY WITH PROPOFOL (Rectum) POLYPECTOMY (Rectum)     Patient location during evaluation: PACU Anesthesia Type: General Level of consciousness: awake and alert Pain management: pain level controlled Vital Signs Assessment: post-procedure vital signs reviewed and stable Respiratory status: spontaneous breathing, nonlabored ventilation and respiratory function stable Cardiovascular status: blood pressure returned to baseline and stable Postop Assessment: no apparent nausea or vomiting Anesthetic complications: no   No notable events documented.  [name]

## 2022-04-08 NOTE — Op Note (Signed)
[facility] Patient Name: Ronald Ortega Procedure Date: 04/08/2022 8:21 AM MRN: [id number] Account #: [id number] Date of Birth: [birth year] Admit Type: Outpatient Age: 46 Room: [facility] OR ROOM 01 Gender: Male Note Status: Finalized Instrument Name: [id number] Procedure:             Colonoscopy Indications:           Screening in patient at increased risk: Family history                         of 1st-degree relative with colorectal cancer before                         age 38 years, Screening in patient at increased risk:                         Colorectal cancer in father before age 1, This is the                         patient's first colonoscopy Providers:             Lin Landsman MD, MD Medicines:             General Anesthesia Complications:         No immediate complications. Estimated blood loss: None. Procedure:             Pre-Anesthesia Assessment:                        - Prior to the procedure, a History and Physical was                         performed, and patient medications and allergies were                         reviewed. The patient is competent. The risks and                         benefits of the procedure and the sedation options and                         risks were discussed with the patient. All questions                         were answered and informed consent was obtained.                         Patient identification and proposed procedure were                         verified by the physician, the nurse, the                         anesthesiologist, the anesthetist and the technician                         in the pre-procedure area in the procedure room in the  endoscopy suite. Mental Status Examination: alert and                         oriented. Airway Examination: normal oropharyngeal                         airway and neck mobility. Respiratory Examination:                          clear to auscultation. CV Examination: normal.                         Prophylactic Antibiotics: The patient does not require                         prophylactic antibiotics. Prior Anticoagulants: The                         patient has taken no previous anticoagulant or                         antiplatelet agents. ASA Grade Assessment: II - A                         patient with mild systemic disease. After reviewing                         the risks and benefits, the patient was deemed in                         satisfactory condition to undergo the procedure. The                         anesthesia plan was to use general anesthesia.                         Immediately prior to administration of medications,                         the patient was re-assessed for adequacy to receive                         sedatives. The heart rate, respiratory rate, oxygen                         saturations, blood pressure, adequacy of pulmonary                         ventilation, and response to care were monitored                         throughout the procedure. The physical status of the                         patient was re-assessed after the procedure.                        After obtaining informed consent, the colonoscope was  passed under direct vision. Throughout the procedure,                         the patient's blood pressure, pulse, and oxygen                         saturations were monitored continuously. The                         Colonoscope was introduced through the anus and                         advanced to the 10 cm into the ileum. The colonoscopy                         was performed without difficulty. The patient                         tolerated the procedure well. The quality of the bowel                         preparation was evaluated using the BBPS [facility] Bowel                         Preparation Scale) with scores of: Right Colon = 3,                          Transverse Colon = 3 and Left Colon = 3 (entire mucosa                         seen well with no residual staining, small fragments                         of stool or opaque liquid). The total BBPS score                         equals 9. Findings:      The perianal and digital rectal examinations were normal. Pertinent       negatives include normal sphincter tone and no palpable rectal lesions.      The terminal ileum appeared normal.      Five sessile polyps were found in the rectum 3, transverse colon 1 and       cecum 1. The polyps were 5 to 6 mm in size. These polyps were removed       with a cold snare. Resection and retrieval were complete. Estimated       blood loss: none.      Non-bleeding external hemorrhoids were found during retroflexion. The       hemorrhoids were medium-sized. Impression:            - The examined portion of the ileum was normal.                        - Five 5 to 6 mm polyps in the rectum, in the                         transverse colon and in the cecum,  removed with a cold                         snare. Resected and retrieved.                        - Non-bleeding external hemorrhoids. Recommendation:        - Discharge patient to home (with spouse).                        - Resume previous diet today.                        - Continue present medications.                        - Await pathology results.                        - Repeat colonoscopy in 3 - 5 years for surveillance                         of multiple polyps. Procedure Code(s):     --- Professional ---                        [phone], Colonoscopy, flexible; with removal of                         tumor(s), polyp(s), or other lesion(s) by snare                         technique Diagnosis Code(s):     --- Professional ---                        K64.4, Residual hemorrhoidal skin tags                        K62.1, Rectal polyp                        K63.5, Polyp of colon                         Z80.0, Family history of malignant neoplasm of                         digestive organs CPT copyright 2019 American Medical Association. All rights reserved. The codes documented in this report are preliminary and upon coder review may  be revised to meet current compliance requirements. Dr. Ulyess Mort Lin Landsman MD, MD 04/08/2022 9:00:44 AM This report has been signed electronically. Number of Addenda: 0 Note Initiated On: 04/08/2022 8:21 AM Scope Withdrawal Time: 0 hours 19 minutes 57 seconds  Total Procedure Duration: 0 hours 26 minutes 19 seconds  Estimated Blood Loss:  Estimated blood loss: none. Estimated blood loss: none.      [facility]

## 2022-04-08 NOTE — Anesthesia Preprocedure Evaluation (Addendum)
Anesthesia Evaluation  Patient identified by MRN, date of birth, ID band Patient awake    Reviewed: Allergy & Precautions, H&P , NPO status , Patient's Chart, lab work & pertinent test results, reviewed documented beta blocker date and time   Airway Mallampati: III  TM Distance: >3 FB Neck ROM: full    Dental no notable dental hx.    Pulmonary neg pulmonary ROS,    Pulmonary exam normal breath sounds clear to auscultation       Cardiovascular Exercise Tolerance: Good negative cardio ROS   Rhythm:regular Rate:Normal     Neuro/Psych negative neurological ROS  negative psych ROS   GI/Hepatic Neg liver ROS, GERD  ,  Endo/Other  negative endocrine ROS  Renal/GU negative Renal ROS  negative genitourinary   Musculoskeletal   Abdominal   Peds  Hematology negative hematology ROS (+)   Anesthesia Other Findings   Reproductive/Obstetrics negative OB ROS                            Anesthesia Physical Anesthesia Plan  ASA: 2  Anesthesia Plan: General   Post-op Pain Management:    Induction:   PONV Risk Score and Plan: 2 and TIVA, Treatment may vary due to age or medical condition and Propofol infusion  Airway Management Planned:   Additional Equipment:   Intra-op Plan:   Post-operative Plan:   Informed Consent: I have reviewed the patients History and Physical, chart, labs and discussed the procedure including the risks, benefits and alternatives for the proposed anesthesia with the patient or authorized representative who has indicated his/her understanding and acceptance.     Dental Advisory Given  Plan Discussed with: CRNA  Anesthesia Plan Comments:         Anesthesia Quick Evaluation

## 2022-04-08 NOTE — H&P (Signed)
  Ronald Darby, MD [address]  [place]  [place], [place] [address]  Main: [phone]  Fax: [phone] Pager: [phone]  Primary Care Physician:  Mikey Kirschner, PA-C Primary Gastroenterologist:  Dr. Cephas Ortega  Pre-Procedure History & Physical: HPI:  Ronald Ortega is a 46 y.o. male is here for an colonoscopy.   Past Medical History:  Diagnosis Date   Allergy    GERD (gastroesophageal reflux disease)     Past Surgical History:  Procedure Laterality Date   CHOLECYSTECTOMY  1994   Reconstructive Tendons Right 1990   TONSILLECTOMY  1980    Prior to Admission medications   Medication Sig Start Date End Date Taking? Authorizing Provider  Cetirizine HCl (ZYRTEC PO) Take by mouth.   Yes [provider]  fluticasone (FLONASE) 50 MCG/ACT nasal spray Place 2 sprays into both nostrils daily. 03/15/22  Yes Drubel, Ria Comment, PA-C  Phenylephrine-Acetaminophen (TYLENOL SINUS+HEADACHE PO) Take by mouth.   Yes [provider]    Allergies as of 03/16/2022   (No Known Allergies)    Family History  Problem Relation Age of Onset   Hypertension Mother    Colon cancer Father     Social History   Socioeconomic History   Marital status: Married    Spouse name: Not on file   Number of children: Not on file   Years of education: Not on file   Highest education level: Not on file  Occupational History   Not on file  Tobacco Use   Smoking status: Never   Smokeless tobacco: Never  Vaping Use   Vaping Use: Never used  Substance and Sexual Activity   Alcohol use: No   Drug use: No   Sexual activity: Not on file  Other Topics Concern   Not on file  Social History Narrative   Not on file   Social Determinants of Health   Financial Resource Strain: Not on file  Food Insecurity: Not on file  Transportation Needs: Not on file  Physical Activity: Not on file  Stress: Not on file  Social Connections: Not on file  Intimate Partner  Violence: Not on file    Review of Systems: See HPI, otherwise negative ROS  Physical Exam: BP (!) 138/96   Pulse (!) 134   Temp 98.2 F (36.8 C) (Temporal)   Resp 20   Ht 6' (1.829 m)   Wt 116.9 kg   SpO2 96%   BMI 34.95 kg/m  General:   Alert,  pleasant and cooperative in NAD Head:  Normocephalic and atraumatic. Neck:  Supple; no masses or thyromegaly. Lungs:  Clear throughout to auscultation.    Heart:  Regular rate and rhythm. Abdomen:  Soft, nontender and nondistended. Normal bowel sounds, without guarding, and without rebound.   Neurologic:  Alert and  oriented x4;  grossly normal neurologically.  Impression/Plan: Ronald Ortega is here for an colonoscopy to be performed for family h/o colon cancer  Risks, benefits, limitations, and alternatives regarding  colonoscopy have been reviewed with the patient.  Questions have been answered.  All parties agreeable.   Sherri Sear, MD  04/08/2022, 7:50 AM

## 2022-04-09 ENCOUNTER — Encounter: Payer: Self-pay | Admitting: Gastroenterology

## 2022-04-12 ENCOUNTER — Encounter: Payer: Self-pay | Admitting: Gastroenterology

## 2022-04-12 LAB — SURGICAL PATHOLOGY

## 2022-05-23 ENCOUNTER — Encounter: Payer: Self-pay | Admitting: Emergency Medicine

## 2022-05-23 ENCOUNTER — Ambulatory Visit
Admission: EM | Admit: 2022-05-23 | Discharge: 2022-05-23 | Disposition: A | Discharge status: Home / Self Care | Payer: Commercial Managed Care - PPO | Attending: Emergency Medicine | Admitting: Emergency Medicine

## 2022-05-23 DIAGNOSIS — Z20822 Contact with and (suspected) exposure to covid-19: Secondary | ICD-10-CM | POA: Diagnosis not present

## 2022-05-23 DIAGNOSIS — R21 Rash and other nonspecific skin eruption: Secondary | ICD-10-CM | POA: Insufficient documentation

## 2022-05-23 DIAGNOSIS — R509 Fever, unspecified: Secondary | ICD-10-CM | POA: Diagnosis present

## 2022-05-23 LAB — RESP PANEL BY RT-PCR (FLU A&B, COVID) ARPGX2
Influenza A by PCR: NEGATIVE
Influenza B by PCR: NEGATIVE
SARS Coronavirus 2 by RT PCR: NEGATIVE

## 2022-05-23 MED ORDER — DOXYCYCLINE HYCLATE 100 MG PO CAPS: 100.0000 mg | ORAL_CAPSULE | Freq: Two times a day (BID) | ORAL | 0 refills | Status: AC

## 2022-05-23 MED ORDER — IBUPROFEN 600 MG PO TABS: 600.0000 mg | ORAL_TABLET | Freq: Four times a day (QID) | ORAL | 0 refills | Status: DC | PRN

## 2022-05-23 NOTE — ED Provider Notes (Signed)
HPI  SUBJECTIVE:  Ronald Ortega is a 46 y.o. male who presents with 1 week of fatigue, malaise, intermittent mild headache, body aches.  He developed fevers Tmax 101.32 to 3 days ago and a flat, erythematous, nonpainful, nonpruritic rash starting on his arms that has now spread to his torso and upper leg.  He is not sure if he has had a tick bite in the past month-he states that he had an area underneath his right breast that he thought was a "zit" and scratched it.  He did not see a tick.  However, his symptoms started shortly after.  He reports mild nasal congestion, occasional cough.  No rash on the palms of his hands, soles of his feet, neck stiffness, photophobia, rhinorrhea, sore throat, wheezing, shortness of breath, chest pain, palpitations, abdominal pain, nausea, vomiting, diarrhea.  No antibiotics in the past month.  He does not take any medications on a regular basis.  He has been taking Tylenol alternating with ibuprofen for the fevers and headache, and Benadryl for the rash.  Symptoms are better with the Tylenol and ibuprofen.  No aggravating factors.  He has no past medical history.  PCP: [facility]  [place], and has 4 dogs, 1 cat.  Past Medical History:  Diagnosis Date   Allergy    GERD (gastroesophageal reflux disease)     Past Surgical History:  Procedure Laterality Date   CHOLECYSTECTOMY  1994   COLONOSCOPY WITH PROPOFOL N/A 04/08/2022   Procedure: COLONOSCOPY WITH PROPOFOL;  Surgeon: Lin Landsman, MD;  Location: [facility];  Service: Endoscopy;  Laterality: N/A;   POLYPECTOMY  04/08/2022   Procedure: POLYPECTOMY;  Surgeon: Lin Landsman, MD;  Location: [facility];  Service: Endoscopy;;   Reconstructive Tendons Right 1990   TONSILLECTOMY  1980    Family History  Problem Relation Age of Onset   Hypertension Mother    Colon cancer Father     Social History   Tobacco Use   Smoking status:  Never   Smokeless tobacco: Never  Vaping Use   Vaping Use: Never used  Substance Use Topics   Alcohol use: No   Drug use: No    No current facility-administered medications for this encounter.  Current Outpatient Medications:    doxycycline (VIBRAMYCIN) 100 MG capsule, Take 1 capsule (100 mg total) by mouth 2 (two) times daily for 10 days., Disp: 20 capsule, Rfl: 0   fluticasone (FLONASE) 50 MCG/ACT nasal spray, Place 2 sprays into both nostrils daily., Disp: 16 g, Rfl: 6   ibuprofen (ADVIL) 600 MG tablet, Take 1 tablet (600 mg total) by mouth every 6 (six) hours as needed., Disp: 30 tablet, Rfl: 0  No Known Allergies   ROS  As noted in HPI.   Physical Exam  BP (!) 125/93 (BP Location: Left Arm)   Pulse (!) 119   Temp 98.6 F (37 C) (Oral)   Resp 15   Ht 6' (1.829 m)   Wt 113.4 kg   SpO2 97%   BMI 33.91 kg/m   Constitutional: Well developed, well nourished, no acute distress Eyes:  EOMI, conjunctiva normal bilaterally HENT: Normocephalic, atraumatic,mucus membranes moist.  Mild nasal congestion.  No maxillary, frontal sinus tenderness. Neck: No cervical lymphadenopathy, meningismus Respiratory: Normal inspiratory effort, lungs clear bilaterally Cardiovascular: Regular tachycardia, no murmurs, rubs, gallop GI: nondistended soft, nontender, no rebound, guarding skin: Erythematous area underneath right breast.  No target lesion. this is the  original lesion that patient noticed    Nontender, flat, blanchable macular rash over arms, torso, upper thighs.  No rash on the palms of hands, soles of feet or on face.  No vesicles             Musculoskeletal: no deformities Neurologic: Alert & oriented x 3, no focal neuro deficits Psychiatric: Speech and behavior appropriate   ED Course   Medications - No data to display  Orders Placed This Encounter  Procedures   Resp Panel by RT-PCR (Flu A&B, Covid) Anterior Nasal Swab    Standing Status:   Standing     Number of Occurrences:   1   [facility] spotted fvr abs pnl(IgG+IgM)    Standing Status:   Standing    Number of Occurrences:   1   Lyme Disease Serology w/Reflex    Standing Status:   Standing    Number of Occurrences:   1   Airborne and Contact precautions    Standing Status:   Standing    Number of Occurrences:   1    Results for orders placed or performed during the hospital encounter of 05/23/22 (from the past 24 hour(s))  Resp Panel by RT-PCR (Flu A&B, Covid) Anterior Nasal Swab     Status: None   Collection Time: 05/23/22 12:57 PM   Specimen: Anterior Nasal Swab  Result Value Ref Range   SARS Coronavirus 2 by RT PCR NEGATIVE NEGATIVE   Influenza A by PCR NEGATIVE NEGATIVE   Influenza B by PCR NEGATIVE NEGATIVE   No results found.  ED Clinical Impression  1. Fever, unspecified fever cause   2. Rash      ED Assessment/Plan  Patient with fever and a rash.  COVID, influenza negative.  He has not had any recent medications or antibiotics, doubt drug reaction.  He has no neck stiffness, photophobia, signs or symptoms of meningitis.  Concern for tickborne illness given that he [place] area, and had a recent lesion that could have been a tick bite.  Will send home with 10 days of doxycycline, check [facility] spotted fever and Lyme disease serologies.  Advised patient to finish doxycycline, even if he feels better.  We will extend his doxycycline if Lyme serologies come back positive.  Contact patient at [phone]  Discussed labs,  MDM, treatment plan, and plan for follow-up with patient. Discussed sn/sx that should prompt return to the ED. patient agrees with plan.   Meds ordered this encounter  Medications   ibuprofen (ADVIL) 600 MG tablet    Sig: Take 1 tablet (600 mg total) by mouth every 6 (six) hours as needed.    Dispense:  30 tablet    Refill:  0   doxycycline (VIBRAMYCIN) 100 MG capsule    Sig: Take 1 capsule (100 mg total) by mouth 2 (two) times daily  for 10 days.    Dispense:  20 capsule    Refill:  0      *This clinic note was created using [employer]. Therefore, there may be occasional mistakes despite careful proofreading.  ?    Melynda Ripple, MD 05/23/22 1352

## 2022-05-23 NOTE — ED Triage Notes (Signed)
Patient states that earlier las week he felt tired and fatigued.  Patient states that couple days ago he started running a fever 101 and developed a non itchy red rash on his arms and chest.  Patient has been taking tylenol.

## 2022-05-23 NOTE — Discharge Instructions (Addendum)
We will contact you if your labs, are positive.  Finish doxycycline, even if you feel better.  May take 600 mg of ibuprofen, 1000 mg of Tylenol together 3-4 times a day as needed for body aches, headaches, fevers

## 2022-05-26 LAB — LYME DISEASE SEROLOGY W/REFLEX: Lyme Total Antibody EIA: NEGATIVE

## 2022-05-26 LAB — ROCKY MTN SPOTTED FVR ABS PNL(IGG+IGM)
RMSF IgG: NEGATIVE
RMSF IgM: 0.23 index (ref 0.00–0.89)

## 2022-10-24 ENCOUNTER — Ambulatory Visit
Admission: EM | Admit: 2022-10-24 | Discharge: 2022-10-24 | Disposition: A | Discharge status: Home / Self Care | Payer: Commercial Managed Care - PPO | Attending: Family Medicine | Admitting: Family Medicine

## 2022-10-24 DIAGNOSIS — W540XXA Bitten by dog, initial encounter: Secondary | ICD-10-CM | POA: Diagnosis not present

## 2022-10-24 DIAGNOSIS — S41151A Open bite of right upper arm, initial encounter: Secondary | ICD-10-CM | POA: Diagnosis not present

## 2022-10-24 DIAGNOSIS — S41101A Unspecified open wound of right upper arm, initial encounter: Secondary | ICD-10-CM | POA: Diagnosis not present

## 2022-10-24 MED ORDER — AMOXICILLIN-POT CLAVULANATE 875-125 MG PO TABS
1.0000 | ORAL_TABLET | Freq: Two times a day (BID) | ORAL | 0 refills | Status: AC
Start: 2022-10-24 — End: 2022-10-31

## 2022-10-24 MED ORDER — IBUPROFEN 600 MG PO TABS: 600.0000 mg | ORAL_TABLET | Freq: Four times a day (QID) | ORAL | 0 refills | Status: AC | PRN

## 2022-10-24 NOTE — Discharge Instructions (Addendum)

## 2022-10-24 NOTE — ED Provider Notes (Addendum)
[facility]    CSN: [id number] Arrival date & time: 10/24/22  1242      History   Chief Complaint Chief Complaint  Patient presents with   Animal Bite    HPI Ronald Ortega is a 46 y.o. male.   HPI  Ronald Ortega presents for dog bite after breaking up a dog fight around 12 PM today. No treatment prior to arrival.  He reports a tetanus a few months ago at his PCP's office. Wounds are bleeding. Right forearm is red with puncture wounds. Wife reports some of them open. Has multiple bite wounds on right forearm and 2 on the hand.   Past Medical History:  Diagnosis Date   Allergy    GERD (gastroesophageal reflux disease)     Patient Active Problem List   Diagnosis Date Noted   Encounter for screening colonoscopy    Family history of colon cancer in father    Adenomatous polyp of colon    Allergic rhinitis 03/15/2022   Family history of colon cancer 03/15/2022    Past Surgical History:  Procedure Laterality Date   CHOLECYSTECTOMY  1994   COLONOSCOPY WITH PROPOFOL N/A 04/08/2022   Procedure: COLONOSCOPY WITH PROPOFOL;  Surgeon: Lin Landsman, MD;  Location: [facility];  Service: Endoscopy;  Laterality: N/A;   POLYPECTOMY  04/08/2022   Procedure: POLYPECTOMY;  Surgeon: Lin Landsman, MD;  Location: [facility];  Service: Endoscopy;;   Reconstructive Tendons Right [place] Medications    Prior to Admission medications   Medication Sig Start Date End Date Taking? Authorizing Provider  amoxicillin-clavulanate (AUGMENTIN) 875-125 MG tablet Take 1 tablet by mouth every 12 (twelve) hours for 7 days. 10/24/22 10/31/22 Yes Colum Colt, DO  cetirizine (ZYRTEC) 5 MG tablet Take 5 mg by mouth daily.   Yes [provider]  fluticasone (FLONASE) 50 MCG/ACT nasal spray Place 2 sprays into both nostrils daily. 03/15/22  Yes Drubel, Ria Comment, PA-C  ibuprofen (ADVIL) 600 MG tablet Take 1 tablet (600  mg total) by mouth every 6 (six) hours as needed. 10/24/22   Lyndee Hensen, DO    Family History Family History  Problem Relation Age of Onset   Hypertension Mother    Colon cancer Father     Social History Social History   Tobacco Use   Smoking status: Never   Smokeless tobacco: Never  Vaping Use   Vaping Use: Never used  Substance Use Topics   Alcohol use: No   Drug use: No     Allergies   Patient has no known allergies.   Review of Systems Review of Systems :negative unless otherwise stated in HPI.      Physical Exam Triage Vital Signs ED Triage Vitals  Enc Vitals Group     BP 10/24/22 1455 (!) 119/94     Pulse Rate 10/24/22 1452 87     Resp 10/24/22 1452 18     Temp 10/24/22 1452 98.3 F (36.8 C)     Temp Source 10/24/22 1452 Oral     SpO2 10/24/22 1452 98 %     Weight --      Height --      Head Circumference --      Peak Flow --      Pain Score 10/24/22 1455 4     Pain Loc --      Pain Edu? --      Excl.  in [place]? --    No data found.  Updated Vital Signs BP (!) 119/94 (BP Location: Left Arm)   Pulse 87   Temp 98.3 F (36.8 C) (Oral)   Resp 18   SpO2 98%   Visual Acuity Right Eye Distance:   Left Eye Distance:   Bilateral Distance:    Right Eye Near:   Left Eye Near:    Bilateral Near:     Physical Exam  GEN: alert, well appearing male, in no acute distress  EYES: extra occular movements intact, CV: regular rate, palpable radial pulses RESP: no increased work of breathing MSK: RUE with several puncture wounds, normal ROM of shoulder, elbow, wrist and fingers/thumb, strength 5/5 in all directions NEURO: alert, moves all extremities appropriately, normal gait PSYCH: Normal affect, appropriate speech and behavior  SKIN: warm and dry; multiple puncture wounds, 3 1-1.5cm lacerations,            UC Treatments / Results  Labs (all labs ordered are listed, but only abnormal results are displayed) Labs Reviewed - No data to  display  EKG   Radiology No results found.  Procedures Procedures (including critical care time)  Medications Ordered in UC Medications - No data to display  Initial Impression / Assessment and Plan / UC Course  I have reviewed the triage vital signs and the nursing notes.  Pertinent labs & imaging results that were available during my care of the patient were reviewed by me and considered in my medical decision making (see chart for details).     Patient is a 46 y.o. male who presents for dog bite.  Treat with Augmentin.  Advised to watch for signs of infection.  These known dogs rabies vaccines are up-to-date.  No need for rabies vaccination or IG at this time. On exam, he has several open wounds and scratches on his right forearm and hand. Offered xray to ensure no bone involvement but pt declined. Cleaned extensively. Topical antibiotic applied. Dressed with sterile dressing. Ibuprofen as needed for pain. Tetanus given in April 2023.   Reviewed expectations regarding course of current medical issues.  All questions asked were answered.  Outlined signs and symptoms indicating need for more acute intervention. Patient verbalized understanding. After Visit Summary given.   Final Clinical Impressions(s) / UC Diagnoses   Final diagnoses:  Dog bite of right upper extremity, initial encounter  Open wound of right upper extremity, initial encounter     Discharge Instructions      Stop by the pharmacy to pick up your prescriptions.  Follow up with your primary care provider as needed.  Go to ED for red flag symptoms, including; fevers you cannot reduce with Tylenol/Motrin, severe headaches, vision changes, numbness/weakness in part of the body, lethargy, confusion, intractable vomiting, severe dehydration, chest pain, breathing difficulty, severe persistent abdominal or pelvic pain, signs of severe infection (increased redness, swelling of an area), feeling faint or passing out,  dizziness, etc. You should especially go to the ED for sudden acute worsening of condition if you do not elect to go at this time.       ED Prescriptions     Medication Sig Dispense Auth. Provider   amoxicillin-clavulanate (AUGMENTIN) 875-125 MG tablet Take 1 tablet by mouth every 12 (twelve) hours for 7 days. 14 tablet Kenya Kook, DO   ibuprofen (ADVIL) 600 MG tablet Take 1 tablet (600 mg total) by mouth every 6 (six) hours as needed. 30 tablet Izabella Marcantel, Ronnette Juniper, DO  PDMP not reviewed this encounter.        Lyndee Hensen, DO 10/24/22 1720

## 2022-10-24 NOTE — ED Triage Notes (Addendum)
Patients was breaking up a fight between their dogs and got his arm bite in a few places. Has some deep puncture wounds in right upper arm. Patient was able to show the dog that bite him vet records to show that their dog is up to date with rabies shot and is not due til 2025.

## 2022-11-09 ENCOUNTER — Other Ambulatory Visit: Payer: Self-pay | Admitting: Physician Assistant

## 2022-11-09 DIAGNOSIS — J309 Allergic rhinitis, unspecified: Secondary | ICD-10-CM

## 2023-09-25 ENCOUNTER — Ambulatory Visit
Admission: EM | Admit: 2023-09-25 | Discharge: 2023-09-25 | Disposition: A | Discharge status: Home / Self Care | Payer: Commercial Managed Care - PPO | Attending: Family Medicine | Admitting: Family Medicine

## 2023-09-25 ENCOUNTER — Other Ambulatory Visit: Payer: Self-pay | Admitting: Physician Assistant

## 2023-09-25 ENCOUNTER — Encounter: Payer: Self-pay | Admitting: Emergency Medicine

## 2023-09-25 DIAGNOSIS — J069 Acute upper respiratory infection, unspecified: Secondary | ICD-10-CM | POA: Diagnosis present

## 2023-09-25 DIAGNOSIS — J309 Allergic rhinitis, unspecified: Secondary | ICD-10-CM

## 2023-09-25 DIAGNOSIS — R059 Cough, unspecified: Secondary | ICD-10-CM | POA: Insufficient documentation

## 2023-09-25 DIAGNOSIS — B9789 Other viral agents as the cause of diseases classified elsewhere: Secondary | ICD-10-CM | POA: Diagnosis not present

## 2023-09-25 DIAGNOSIS — Z1152 Encounter for screening for COVID-19: Secondary | ICD-10-CM | POA: Diagnosis not present

## 2023-09-25 LAB — SARS CORONAVIRUS 2 BY RT PCR: SARS Coronavirus 2 by RT PCR: NEGATIVE

## 2023-09-25 MED ORDER — IPRATROPIUM BROMIDE 0.06 % NA SOLN: 2.0000 | Freq: Four times a day (QID) | NASAL | 12 refills | Status: AC

## 2023-09-25 NOTE — ED Triage Notes (Signed)
Patient c/o nasal congestion, cough, and runny nose that started on Wed.  Patient also reports a post nasal drip.  Patient denies fevers.

## 2023-09-25 NOTE — Discharge Instructions (Addendum)
Your Covid test is negative.  You can take Tylenol and/or Ibuprofen as needed for fever reduction and pain relief.    For cough: honey 1/2 to 1 teaspoon (you can dilute the honey in water or another fluid).  You can also use guaifenesin and dextromethorphan for cough. You can use a humidifier for chest congestion and cough.  If you don't have a humidifier, you can sit in the bathroom with the hot shower running.      For sore throat: try warm salt water gargles, Mucinex sore throat cough drops or cepacol lozenges, throat spray, warm tea or water with lemon/honey, popsicles or ice, or OTC cold relief medicine for throat discomfort. You can also purchase chloraseptic spray at the pharmacy or dollar store.   For congestion: take a daily anti-histamine like Zyrtec, Claritin, and a oral decongestant, such as pseudoephedrine.  You can also use Flonase 1-2 sprays in each nostril daily. Afrin is also a good option, if you do not have high blood pressure.    It is important to stay hydrated: drink plenty of fluids (water, gatorade/powerade/pedialyte, juices, or teas) to keep your throat moisturized and help further relieve irritation/discomfort.    Return or go to the Emergency Department if symptoms worsen or do not improve in the next few days

## 2023-09-25 NOTE — ED Provider Notes (Signed)
[facility]    CSN: [id number] Arrival date & time: 09/25/23  7829      History   Chief Complaint Chief Complaint  Patient presents with   Nasal Congestion   Cough    HPI PLATON AROCHO is a 47 y.o. male.   HPI  History obtained from the patient. Jhonathan presents for severe nasal congestion, postnasal drip, cough and rhinorrhea that started on Wednesday.  No fevers. Last night, it was really bad and he was not able to breathe threw his nose.  He gagged in his sleep and felt like her couldn't breathe due to the drainage hitting his Mucinex. Has slight cough and chills. Has right sided facial pain and dental pain  A couple of his family members has had similar sx.       Past Medical History:  Diagnosis Date   Allergy    GERD (gastroesophageal reflux disease)     Patient Active Problem List   Diagnosis Date Noted   Encounter for screening colonoscopy    Family history of colon cancer in father    Adenomatous polyp of colon    Allergic rhinitis 03/15/2022   Family history of colon cancer 03/15/2022    Past Surgical History:  Procedure Laterality Date   CHOLECYSTECTOMY  1994   COLONOSCOPY WITH PROPOFOL N/A 04/08/2022   Procedure: COLONOSCOPY WITH PROPOFOL;  Surgeon: Toney Reil, MD;  Location: [facility];  Service: Endoscopy;  Laterality: N/A;   POLYPECTOMY  04/08/2022   Procedure: POLYPECTOMY;  Surgeon: Toney Reil, MD;  Location: [facility];  Service: Endoscopy;;   Reconstructive Tendons Right 1990   TONSILLECTOMY  1980       Home Medications    Prior to Admission medications   Medication Sig Start Date End Date Taking? Authorizing Provider  fluticasone (FLONASE) 50 MCG/ACT nasal spray SPRAY 2 SPRAYS INTO EACH NOSTRIL EVERY DAY 11/09/22  Yes Drubel, Lillia Abed, PA-C  ipratropium (ATROVENT) 0.06 % nasal spray Place 2 sprays into both nostrils 4 (four) times daily. 09/25/23  Yes Ostin Mathey, DO   cetirizine (ZYRTEC) 5 MG tablet Take 5 mg by mouth daily.    [provider]  ibuprofen (ADVIL) 600 MG tablet Take 1 tablet (600 mg total) by mouth every 6 (six) hours as needed. 10/24/22   Katha Cabal, DO    Family History Family History  Problem Relation Age of Onset   Hypertension Mother    Colon cancer Father     Social History Social History   Tobacco Use   Smoking status: Never   Smokeless tobacco: Never  Vaping Use   Vaping status: Never Used  Substance Use Topics   Alcohol use: No   Drug use: No     Allergies   Patient has no known allergies.   Review of Systems Review of Systems: negative unless otherwise stated in HPI.      Physical Exam Triage Vital Signs ED Triage Vitals  Encounter Vitals Group     BP 09/25/23 0833 (!) 149/88     Systolic BP Percentile --      Diastolic BP Percentile --      Pulse Rate 09/25/23 0833 (!) 104     Resp 09/25/23 0833 15     Temp 09/25/23 0833 98.6 F (37 C)     Temp Source 09/25/23 0833 Oral     SpO2 09/25/23 0833 98 %     Weight 09/25/23 0831 250 lb (113.4 kg)  Height 09/25/23 0831 6' (1.829 m)     Head Circumference --      Peak Flow --      Pain Score 09/25/23 0831 2     Pain Loc --      Pain Education --      Exclude from Growth Chart --    No data found.  Updated Vital Signs BP (!) 149/88 (BP Location: Left Arm)   Pulse (!) 104   Temp 98.6 F (37 C) (Oral)   Resp 15   Ht 6' (1.829 m)   Wt 113.4 kg   SpO2 98%   BMI 33.91 kg/m   Visual Acuity Right Eye Distance:   Left Eye Distance:   Bilateral Distance:    Right Eye Near:   Left Eye Near:    Bilateral Near:     Physical Exam GEN:     alert, non-toxic appearing male in no distress    HENT:  mucus membranes moist, oropharyngeal without lesions or erythema, no tonsillar hypertrophy or exudates,  moderate erythematous edematous turbinates, clear nasal discharge, bilateral TM normal, no maxillary or frontal sinus  tenderness EYES:   pupils equal and reactive, no scleral injection or discharge NECK:  normal ROM,no meningismus   RESP:  no increased work of breathing Skin:   warm and dry, no rash on visible skin    UC Treatments / Results  Labs (all labs ordered are listed, but only abnormal results are displayed) Labs Reviewed  SARS CORONAVIRUS 2 BY RT PCR    EKG   Radiology No results found.  Procedures Procedures (including critical care time)  Medications Ordered in UC Medications - No data to display  Initial Impression / Assessment and Plan / UC Course  I have reviewed the triage vital signs and the nursing notes.  Pertinent labs & imaging results that were available during my care of the patient were reviewed by me and considered in my medical decision making (see chart for details).       Pt is a 47 y.o. male who presents for 4 days of respiratory symptoms. Thadeus is afebrile here without recent antipyretics. Satting well on room air. Overall pt is non-toxic appearing, well hydrated, without respiratory distress. Pulmonary exam is unremarkable.  He has no evidence of bacterial sinusitis on exam.  COVID testing obtained and was negative.    History consistent with viral respiratory illness. Discussed symptomatic treatment.  Explained lack of efficacy of antibiotics in viral disease.  Typical duration of symptoms discussed.  Start Atrovent nasal spray and Xlear as needed.  Return and ED precautions given and voiced understanding. Discussed MDM, treatment plan and plan for follow-up with patient who agrees with plan.     Final Clinical Impressions(s) / UC Diagnoses   Final diagnoses:  Viral URI with cough     Discharge Instructions      Your Covid test is negative.  You can take Tylenol and/or Ibuprofen as needed for fever reduction and pain relief.    For cough: honey 1/2 to 1 teaspoon (you can dilute the honey in water or another fluid).  You can also use  guaifenesin and dextromethorphan for cough. You can use a humidifier for chest congestion and cough.  If you don't have a humidifier, you can sit in the bathroom with the hot shower running.      For sore throat: try warm salt water gargles, Mucinex sore throat cough drops or cepacol lozenges, throat spray, warm tea or  water with lemon/honey, popsicles or ice, or OTC cold relief medicine for throat discomfort. You can also purchase chloraseptic spray at the pharmacy or dollar store.   For congestion: take a daily anti-histamine like Zyrtec, Claritin, and a oral decongestant, such as pseudoephedrine.  You can also use Flonase 1-2 sprays in each nostril daily. Afrin is also a good option, if you do not have high blood pressure.    It is important to stay hydrated: drink plenty of fluids (water, gatorade/powerade/pedialyte, juices, or teas) to keep your throat moisturized and help further relieve irritation/discomfort.    Return or go to the Emergency Department if symptoms worsen or do not improve in the next few days      ED Prescriptions     Medication Sig Dispense Auth. Provider   ipratropium (ATROVENT) 0.06 % nasal spray Place 2 sprays into both nostrils 4 (four) times daily. 15 mL Katha Cabal, DO      PDMP not reviewed this encounter.   Katha Cabal, DO 09/25/23 1113

## 2023-10-11 ENCOUNTER — Other Ambulatory Visit: Payer: Self-pay | Admitting: Physician Assistant

## 2023-10-11 DIAGNOSIS — J309 Allergic rhinitis, unspecified: Secondary | ICD-10-CM

## 2024-04-01 ENCOUNTER — Emergency Department
Admission: EM | Admit: 2024-04-01 | Discharge: 2024-04-01 | Disposition: A | Discharge status: Home / Self Care | Attending: Emergency Medicine | Admitting: Emergency Medicine

## 2024-04-01 ENCOUNTER — Encounter: Payer: Self-pay | Admitting: Emergency Medicine

## 2024-04-01 ENCOUNTER — Emergency Department

## 2024-04-01 ENCOUNTER — Other Ambulatory Visit: Payer: Self-pay

## 2024-04-01 DIAGNOSIS — R42 Dizziness and giddiness: Secondary | ICD-10-CM | POA: Insufficient documentation

## 2024-04-01 DIAGNOSIS — E119 Type 2 diabetes mellitus without complications: Secondary | ICD-10-CM | POA: Diagnosis not present

## 2024-04-01 DIAGNOSIS — I10 Essential (primary) hypertension: Secondary | ICD-10-CM | POA: Diagnosis not present

## 2024-04-01 DIAGNOSIS — R079 Chest pain, unspecified: Secondary | ICD-10-CM | POA: Diagnosis present

## 2024-04-01 LAB — HEPATIC FUNCTION PANEL
ALT: 41 U/L (ref 0–44)
AST: 28 U/L (ref 15–41)
Albumin: 3.9 g/dL (ref 3.5–5.0)
Alkaline Phosphatase: 110 U/L (ref 38–126)
Bilirubin, Direct: <0.1 mg/dL (ref 0.0–0.2)
Total Bilirubin: 0.5 mg/dL (ref 0.0–1.2)
Total Protein: 7.6 g/dL (ref 6.5–8.1)

## 2024-04-01 LAB — TROPONIN I (HIGH SENSITIVITY)
Troponin I (High Sensitivity): 4 ng/L (ref <18)
Troponin I (High Sensitivity): 4 ng/L (ref <18)

## 2024-04-01 LAB — CBC
HCT: 40.9 % (ref 39.0–52.0)
Hemoglobin: 14 g/dL (ref 13.0–17.0)
MCH: 31.8 pg (ref 26.0–34.0)
MCHC: 34.2 g/dL (ref 30.0–36.0)
MCV: 93 fL (ref 80.0–100.0)
Platelets: 194 10*3/uL (ref 150–400)
RBC: 4.4 MIL/uL (ref 4.22–5.81)
RDW: 12.2 % (ref 11.5–15.5)
WBC: 7.8 10*3/uL (ref 4.0–10.5)
nRBC: 0 % (ref 0.0–0.2)

## 2024-04-01 LAB — LIPASE, BLOOD: Lipase: 35 U/L (ref 11–51)

## 2024-04-01 LAB — BASIC METABOLIC PANEL WITH GFR
Anion gap: 8 (ref 5–15)
BUN: 10 mg/dL (ref 6–20)
CO2: 27 mmol/L (ref 22–32)
Calcium: 9.4 mg/dL (ref 8.9–10.3)
Chloride: 103 mmol/L (ref 98–111)
Creatinine, Ser: 0.85 mg/dL (ref 0.61–1.24)
GFR, Estimated: >60 mL/min (ref >60)
Glucose, Bld: 139 mg/dL — ABNORMAL HIGH (ref 70–99)
Potassium: 3.6 mmol/L (ref 3.5–5.1)
Sodium: 138 mmol/L (ref 135–145)

## 2024-04-01 MED ORDER — ASPIRIN 81 MG PO CHEW
324.0000 mg | CHEWABLE_TABLET | Freq: Once | ORAL | Status: AC
Start: 2024-04-01 — End: 2024-04-01
  Administered 2024-04-01: 324 mg via ORAL
  Filled 2024-04-01: qty 4

## 2024-04-01 NOTE — ED Notes (Signed)
 Lab called to add on lipase and haptic function.

## 2024-04-01 NOTE — ED Provider Notes (Signed)
 [facility] Provider Note    Event Date/Time   First MD Initiated Contact with Patient 04/01/24 [id number]     (approximate)   History   Chest Pain   HPI Ronald Ortega is a 48 y.o. male with no known chronic medical issues including no history of heart disease, diabetes, hypertension, nor hyperlipidemia.  He presents for evaluation of acute onset left-sided chest pressure that radiates somewhat to his left shoulder.  It during the night when he was not doing anything in particular.  He describes it as constant.  He said that for about a week he has felt a little bit lightheaded.  He has also had a couple of episodes of milder chest discomfort but it resolves quickly.  This is the first time that it has been as long-lasting and as severe.  He has no first-degree relatives with a history of heart attack or heart disease of which she is aware.  He has no pain ripping or tearing through to his back.  No numbness nor tingling in his extremities.  No nausea, vomiting, nor sweating.     Physical Exam   Triage Vital Signs: ED Triage Vitals  Encounter Vitals Group     BP 04/01/24 0355 (!) 149/91     Systolic BP Percentile --      Diastolic BP Percentile --      Pulse Rate 04/01/24 0355 (!) 103     Resp 04/01/24 0355 (!) 24     Temp 04/01/24 0355 98.1 F (36.7 C)     Temp Source 04/01/24 0355 Oral     SpO2 04/01/24 0355 100 %     Weight 04/01/24 0356 117.9 kg (260 lb)     Height --      Head Circumference --      Peak Flow --      Pain Score 04/01/24 0356 6     Pain Loc --      Pain Education --      Exclude from Growth Chart --     Most recent vital signs: Vitals:   04/01/24 0600 04/01/24 0630  BP: (!) 140/89 (!) 143/86  Pulse: 77 73  Resp: 14 11  Temp:    SpO2: 97% 97%    General: Awake, no distress.  Appears concerned but not in distress. CV:  Good peripheral perfusion.  Regular rate and rhythm, normal heart sounds. Resp:  Normal effort.  Speaking easily and comfortably, no accessory muscle usage nor intercostal retractions.  Lungs are clear to auscultation bilaterally. Abd:  No distention.  No tenderness to palpation of the abdomen.  No pulsatile abdominal mass.   ED Results / Procedures / Treatments   Labs (all labs ordered are listed, but only abnormal results are displayed) Labs Reviewed  BASIC METABOLIC PANEL WITH GFR - Abnormal; Notable for the following components:      Result Value   Glucose, Bld 139 (*)    All other components within normal limits  CBC  HEPATIC FUNCTION PANEL  LIPASE, BLOOD  TROPONIN I (HIGH SENSITIVITY)  TROPONIN I (HIGH SENSITIVITY)     EKG  ED ECG REPORT I, Lynnda Sas, the attending physician, personally viewed and interpreted this ECG.  Date: 04/01/2024 EKG Time: 3:51 Rate: 92 Rhythm: normal sinus rhythm QRS Axis: normal Intervals: normal ST/T Wave abnormalities: normal Narrative Interpretation: no evidence of acute ischemia    RADIOLOGY    PROCEDURES:  Critical Care performed: No  .1-3 Lead EKG  Interpretation  Performed by: Lynnda Sas, MD Authorized by: Lynnda Sas, MD     Interpretation: normal     ECG rate:  85   ECG rate assessment: normal     Rhythm: sinus rhythm     Ectopy: none     Conduction: normal       IMPRESSION / MDM / ASSESSMENT AND PLAN / ED COURSE  I reviewed the triage vital signs and the nursing notes.                              Differential diagnosis includes, but is not limited to, ACS, angina, pneumonia, pneumothorax, PE, AAS, GI related atypical chest pain.  Patient's presentation is most consistent with acute presentation with potential threat to life or bodily function.  Labs/studies ordered: EKG, two-view chest x-ray, high-sensitivity troponin x 2, lipase, hepatic function panel, CBC, BMP  Interventions/Medications given:  Medications  aspirin chewable tablet 324 mg (324 mg Oral Given 04/01/24 0449)    (Note:   hospital course my include additional interventions and/or labs/studies not listed above.)   I ordered a full dose aspirin.  Symptoms sound anginal, but no evidence of ischemia on EKG and high-sensitivity troponin (first 1) is negative.  BMP and CBC are normal.  I added on a lipase and LFTs although he has no tenderness to palpation of the abdomen.  Patient agrees with the plan to monitor and obtain a second high-sensitivity troponin.  He will let us  know if his symptoms change.  After getting the second troponin we will discuss with him his preference about outpatient follow-up with cardiology versus inpatient for possible unstable angina  The patient is on the cardiac monitor to evaluate for evidence of arrhythmia and/or significant heart rate changes.   Clinical Course as of 04/01/24 8295  Sun Apr 01, 2024  0515 DG Chest 2 View I independently viewed and interpreted the patient's CXR and I see no evidence of PNA, pneumothorax, nor other acute abnormality.  Radiology report confirms no acute findings. [CF]  0516 Normal lipase and LFTs [CF]  0655 Troponin I (High Sensitivity): 4 Repeat high-sensitivity troponin is negative and unchanged.  I reassessed the patient.  He has been sleeping.  He said that he does not feel completely normal but better than he did before and has no active pain.  He said he feels like his heart is "pounding" but his heart rate is about 85.  I offered to admit him but explained that we could also provide close outpatient follow-up with cardiology consult and he prefers that option.  I gave him and his wife strict return precautions.  The patient's medical screening exam is reassuring with no indication of an emergent medical condition requiring hospitalization or additional evaluation at this point.  The patient is safe and appropriate for discharge and outpatient follow up. [CF]    Clinical Course User Index [CF] Lynnda Sas, MD     FINAL CLINICAL IMPRESSION(S) / ED  DIAGNOSES   Final diagnoses:  Chest pain, unspecified type     Rx / DC Orders   ED Discharge Orders          Ordered    Ambulatory referral to Cardiology       Comments: If you have not heard from the Cardiology office within the next 72 hours please call [phone].   04/01/24 4696  Note:  This document was prepared using Dragon voice recognition software and may include unintentional dictation errors.   Lynnda Sas, MD 04/01/24 [phone]

## 2024-04-01 NOTE — ED Triage Notes (Addendum)
 Pt in with central cp that began within the hour, radiates to L shoulder - described as constant pressure, 6/10. Pt states he has had trouble sleeping all night, states he feels jittery and felt lightheaded x 1 wk. Reports sob with exertion

## 2024-04-01 NOTE — Discharge Instructions (Signed)
 You have been seen in the Emergency Department (ED) today for chest pain.  As we have discussed today's test results are normal, but you may require further testing.  Please follow up with the recommended doctor as instructed above in these documents regarding today's emergent visit and your recent symptoms to discuss further management.  Continue to take your regular medications.  You may want to take a daily baby aspirin (81 mg) until you have a follow-up appointment.  Return to the Emergency Department (ED) if you experience any further chest pain/pressure/tightness, difficulty breathing, or sudden sweating, or other symptoms that concern you.

## 2024-05-04 ENCOUNTER — Ambulatory Visit: Attending: Cardiology | Admitting: Cardiology

## 2024-05-04 ENCOUNTER — Encounter: Payer: Self-pay | Admitting: Cardiology

## 2024-05-04 VITALS — BP 125/75 | HR 87 | Ht 72.0 in | Wt 276.0 lb

## 2024-05-04 DIAGNOSIS — R072 Precordial pain: Secondary | ICD-10-CM

## 2024-05-04 DIAGNOSIS — I517 Cardiomegaly: Secondary | ICD-10-CM

## 2024-05-04 MED ORDER — METOPROLOL TARTRATE 100 MG PO TABS: ORAL_TABLET | ORAL | 0 refills | Status: AC

## 2024-05-04 NOTE — Progress Notes (Signed)
 Cardiology Office Note:    Date:  05/04/2024   ID:  Ronald Ortega, DOB [birth year], MRN [id number]  PCP:  Vicente Graham, No   [facility] Providers Cardiologist:  None     Referring MD: Lynnda Sas, MD   Chief Complaint  Patient presents with   Follow-up    1 month follow up  from ED pt has been doing well with no complaints of chest pain, chest pressure or SOB, medciation reviewed verbally with patient   Ronald Ortega is a 48 y.o. male who is being seen today for the evaluation of chest pain at the request of Lynnda Sas, MD.   History of Present Illness:    Ronald Ortega is a 48 y.o. male with no known medical history presenting with chest pain.  Seen in the ED a month ago due to symptoms of left-sided chest pressure radiating to the left shoulder while asleep.  Workup in the ED was unrevealing.  He attributes symptoms to stress/anxiety due to recent family members on wife side having heart attack and strokes.  Mother was adopted, he does not know much about his family history.  Denies smoking.  Has not had any episodes since.  Past Medical History:  Diagnosis Date   Allergy    GERD (gastroesophageal reflux disease)     Past Surgical History:  Procedure Laterality Date   CHOLECYSTECTOMY  1994   COLONOSCOPY WITH PROPOFOL  N/A 04/08/2022   Procedure: COLONOSCOPY WITH PROPOFOL ;  Surgeon: Selena Daily, MD;  Location: [facility];  Service: Endoscopy;  Laterality: N/A;   POLYPECTOMY  04/08/2022   Procedure: POLYPECTOMY;  Surgeon: Selena Daily, MD;  Location: [facility];  Service: Endoscopy;;   Reconstructive Tendons Right 1990   TONSILLECTOMY  1980    Current Medications: Current Meds  Medication Sig   cetirizine (ZYRTEC) 5 MG tablet Take 5 mg by mouth daily.   dextromethorphan-guaiFENesin (MUCINEX DM) 30-600 MG 12hr tablet Take 1 tablet by mouth 2 (two) times daily.   fluticasone  (FLONASE ) 50 MCG/ACT nasal spray SPRAY  2 SPRAYS INTO EACH NOSTRIL EVERY DAY   ibuprofen  (ADVIL ) 600 MG tablet Take 1 tablet (600 mg total) by mouth every 6 (six) hours as needed.   ipratropium (ATROVENT ) 0.06 % nasal spray Place 2 sprays into both nostrils 4 (four) times daily.   metoprolol tartrate (LOPRESSOR) 100 MG tablet TAKE 1 TABLET 2 HR PRIOR TO CARDIAC PROCEDURE     Allergies:   Patient has no known allergies.   Social History   Socioeconomic History   Marital status: Married    Spouse name: Not on file   Number of children: Not on file   Years of education: Not on file   Highest education level: Not on file  Occupational History   Not on file  Tobacco Use   Smoking status: Never   Smokeless tobacco: Never  Vaping Use   Vaping status: Never Used  Substance and Sexual Activity   Alcohol use: No   Drug use: No   Sexual activity: Not on file  Other Topics Concern   Not on file  Social History Narrative   Not on file   Social Drivers of Health   Financial Resource Strain: Not on file  Food Insecurity: Not on file  Transportation Needs: Not on file  Physical Activity: Not on file  Stress: Not on file  Social Connections: Not on file     Family History: The patient's family history  includes Colon cancer in his father; Hypertension in his mother.  ROS:   Please see the history of present illness.     All other systems reviewed and are negative.  EKGs/Labs/Other Studies Reviewed:    The following studies were reviewed today:  EKG Interpretation Date/Time:  Friday May 04 2024 11:02:57 EDT Ventricular Rate:  87 PR Interval:  152 QRS Duration:  100 QT Interval:  346 QTC Calculation: 416 R Axis:   -8  Text Interpretation: Normal sinus rhythm Possible Left atrial enlargement Left ventricular hypertrophy ( R in aVL , Cornell product ) Confirmed by Constancia Delton ([id number]) on 05/04/2024 11:09:08 AM    Recent Labs: 04/01/2024: ALT 41; BUN 10; Creatinine, Ser 0.85; Hemoglobin 14.0; Platelets 194;  Potassium 3.6; Sodium 138  Recent Lipid Panel    Component Value Date/Time   CHOL 167 03/15/2022 1448   TRIG 106 03/15/2022 1448   HDL 42 03/15/2022 1448   CHOLHDL 4.0 03/15/2022 1448   LDLCALC 106 (H) 03/15/2022 1448     Risk Assessment/Calculations:              Physical Exam:    VS:  BP 125/75 (BP Location: Left Arm, Patient Position: Sitting, Cuff Size: Normal)   Pulse 87   Ht 6' (1.829 m)   Wt 276 lb (125.2 kg)   SpO2 98%   BMI 37.43 kg/m     Wt Readings from Last 3 Encounters:  05/04/24 276 lb (125.2 kg)  04/01/24 260 lb (117.9 kg)  09/25/23 250 lb (113.4 kg)     GEN:  Well nourished, well developed in no acute distress HEENT: Normal NECK: No JVD; No carotid bruits CARDIAC: RRR, no murmurs, rubs, gallops RESPIRATORY:  Clear to auscultation without rales, wheezing or rhonchi  ABDOMEN: Soft, non-tender, non-distended MUSCULOSKELETAL:  No edema; No deformity  SKIN: Warm and dry NEUROLOGIC:  Alert and oriented x 3 PSYCHIATRIC:  Normal affect   ASSESSMENT:    1. Precordial pain   2. LVH (left ventricular hypertrophy)    PLAN:    In order of problems listed above:  Chest pain, LVH on EKG.  Family history unknown.  Obtain echo, obtain coronary CTA. LVH, evaluated echo as above.  Follow-up after cardiac testing.      Medication Adjustments/Labs and Tests Ordered: Current medicines are reviewed at length with the patient today.  Concerns regarding medicines are outlined above.  Orders Placed This Encounter  Procedures   CT CORONARY MORPH W/CTA COR W/SCORE W/CA W/CM &/OR WO/CM   Basic metabolic panel with GFR   EKG [id number]   ECHOCARDIOGRAM COMPLETE   Meds ordered this encounter  Medications   metoprolol tartrate (LOPRESSOR) 100 MG tablet    Sig: TAKE 1 TABLET 2 HR PRIOR TO CARDIAC PROCEDURE    Dispense:  1 tablet    Refill:  0    Patient Instructions  Medication Instructions:  -take one 100 mg metoprolol two hours prior to CTA  *If you  need a refill on your cardiac medications before your next appointment, please call your pharmacy*  Lab Work: Your provider would like for you to have following labs drawn today BMP.    If you have labs (blood work) drawn today and your tests are completely normal, you will receive your results only by: MyChart Message (if you have MyChart) OR A paper copy in the mail If you have any lab test that is abnormal or we need to change your treatment, we will call you to  review the results.  Testing/Procedures: Your physician has requested that you have an echocardiogram. Echocardiography is a painless test that uses sound waves to create images of your heart. It provides your doctor with information about the size and shape of your heart and how well your heart's chambers and valves are working.   You may receive an ultrasound enhancing agent through an IV if needed to better visualize your heart during the echo. This procedure takes approximately one hour.  There are no restrictions for this procedure.  This will take place at [address] [facility] [address]) [address], [place] [address]  Please note: We ask at that you not bring children with you during ultrasound (echo/ vascular) testing. Due to room size and safety concerns, children are not allowed in the ultrasound rooms during exams. Our front office staff cannot provide observation of children in our lobby area while testing is being conducted. An adult accompanying a patient to their appointment will only be allowed in the ultrasound room at the discretion of the ultrasound technician under special circumstances. We apologize for any inconvenience.     Your cardiac CT will be scheduled at:  [facility] [address] [place], [place] [address] [phone]  Please arrive 15 mins early for check-in and test prep.  There is spacious parking and easy access to the radiology department from the [facility] Heart  and Vascular entrance. Please enter here and check-in with the desk attendant.    Please follow these instructions carefully (unless otherwise directed):  An IV will be required for this test and Nitroglycerin will be given.  Hold all erectile dysfunction medications at least 3 days (72 hrs) prior to test. (Ie viagra, cialis, sildenafil, tadalafil, etc)   On the Night Before the Test: Be sure to Drink plenty of water . Do not consume any caffeinated/decaffeinated beverages or chocolate 12 hours prior to your test. Do not take any antihistamines 12 hours prior to your test.  On the Day of the Test: Drink plenty of water  until 1 hour prior to the test. Do not eat any food 1 hour prior to test. You may take your regular medications prior to the test.  Take metoprolol (Lopressor) two hours prior to test. If you take Furosemide/Hydrochlorothiazide/Spironolactone/Chlorthalidone, please HOLD on the morning of the test. Patients who wear a continuous glucose monitor MUST remove the device prior to scanning. FEMALES- please wear underwire-free bra if available, avoid dresses & tight clothing       After the Test: Drink plenty of water . After receiving IV contrast, you may experience a mild flushed feeling. This is normal. On occasion, you may experience a mild rash up to 24 hours after the test. This is not dangerous. If this occurs, you can take Benadryl 25 mg, Zyrtec, Claritin, or Allegra and increase your fluid intake. (Patients taking Tikosyn should avoid Benadryl, and may take Zyrtec, Claritin, or Allegra) If you experience trouble breathing, this can be serious. If it is severe call 911 IMMEDIATELY. If it is mild, please call our office.  We will call to schedule your test 2-4 weeks out understanding that some insurance companies will need an authorization prior to the service being performed.   For more information and frequently asked questions, please visit our website :  [email or web address]  For non-scheduling related questions, please contact the cardiac imaging nurse navigator should you have any questions/concerns: Cardiac Imaging Nurse Navigators Direct Office Dial: [phone]   For scheduling needs, including cancellations  and rescheduling, please call [place], [phone].    Follow-Up: At [facility], you and your health needs are our priority.  As part of our continuing mission to provide you with exceptional heart care, our providers are all part of one team.  This team includes your primary Cardiologist (physician) and Advanced Practice Providers or APPs (Physician Assistants and Nurse Practitioners) who all work together to provide you with the care you need, when you need it.  Your next appointment:   3 month(s)  Provider:   You may see Dr. Junnie Olives or one of the following Advanced Practice Providers on your designated Care Team:   Laneta Pintos, NP Gildardo Labrador, PA-C Varney Gentleman, PA-C Cadence Woodruff, PA-C Ronald Cockayne, NP Morey Ar, NP    We recommend signing up for the patient portal called MyChart.  Sign up information is provided on this After Visit Summary.  MyChart is used to connect with patients for Virtual Visits (Telemedicine).  Patients are able to view lab/test results, encounter notes, upcoming appointments, etc.  Non-urgent messages can be sent to your provider as well.   To learn more about what you can do with MyChart, go to [email or web address].          Signed, Constancia Delton, MD  05/04/2024 12:02 PM    [facility]

## 2024-05-04 NOTE — Patient Instructions (Signed)
 Medication Instructions:  -take one 100 mg metoprolol two hours prior to CTA  *If you need a refill on your cardiac medications before your next appointment, please call your pharmacy*  Lab Work: Your provider would like for you to have following labs drawn today BMP.    If you have labs (blood work) drawn today and your tests are completely normal, you will receive your results only by: MyChart Message (if you have MyChart) OR A paper copy in the mail If you have any lab test that is abnormal or we need to change your treatment, we will call you to review the results.  Testing/Procedures: Your physician has requested that you have an echocardiogram. Echocardiography is a painless test that uses sound waves to create images of your heart. It provides your doctor with information about the size and shape of your heart and how well your heart's chambers and valves are working.   You may receive an ultrasound enhancing agent through an IV if needed to better visualize your heart during the echo. This procedure takes approximately one hour.  There are no restrictions for this procedure.  This will take place at [address] [facility] [address], [place] [address]  Please note: We ask at that you not bring children with you during ultrasound (echo/ vascular) testing. Due to room size and safety concerns, children are not allowed in the ultrasound rooms during exams. Our front office staff cannot provide observation of children in our lobby area while testing is being conducted. An adult accompanying a patient to their appointment will only be allowed in the ultrasound room at the discretion of the ultrasound technician under special circumstances. We apologize for any inconvenience.     Your cardiac CT will be scheduled at:  [facility] [address] [place], [place] [address] [phone]  Please arrive 15 mins early for check-in and test  prep.  There is spacious parking and easy access to the radiology department from the [facility] Heart and Vascular entrance. Please enter here and check-in with the desk attendant.    Please follow these instructions carefully (unless otherwise directed):  An IV will be required for this test and Nitroglycerin will be given.  Hold all erectile dysfunction medications at least 3 days (72 hrs) prior to test. (Ie viagra, cialis, sildenafil, tadalafil, etc)   On the Night Before the Test: Be sure to Drink plenty of water . Do not consume any caffeinated/decaffeinated beverages or chocolate 12 hours prior to your test. Do not take any antihistamines 12 hours prior to your test.  On the Day of the Test: Drink plenty of water  until 1 hour prior to the test. Do not eat any food 1 hour prior to test. You may take your regular medications prior to the test.  Take metoprolol (Lopressor) two hours prior to test. If you take Furosemide/Hydrochlorothiazide/Spironolactone/Chlorthalidone, please HOLD on the morning of the test. Patients who wear a continuous glucose monitor MUST remove the device prior to scanning. FEMALES- please wear underwire-free bra if available, avoid dresses & tight clothing       After the Test: Drink plenty of water . After receiving IV contrast, you may experience a mild flushed feeling. This is normal. On occasion, you may experience a mild rash up to 24 hours after the test. This is not dangerous. If this occurs, you can take Benadryl 25 mg, Zyrtec, Claritin, or Allegra and increase your fluid intake. (Patients taking Tikosyn should avoid Benadryl,  and may take Zyrtec, Claritin, or Allegra) If you experience trouble breathing, this can be serious. If it is severe call 911 IMMEDIATELY. If it is mild, please call our office.  We will call to schedule your test 2-4 weeks out understanding that some insurance companies will need an authorization prior to the service being performed.    For more information and frequently asked questions, please visit our website : [email or web address]  For non-scheduling related questions, please contact the cardiac imaging nurse navigator should you have any questions/concerns: Cardiac Imaging Nurse Navigators Direct Office Dial: [phone]   For scheduling needs, including cancellations and rescheduling, please call [place], [phone].    Follow-Up: At [facility], you and your health needs are our priority.  As part of our continuing mission to provide you with exceptional heart care, our providers are all part of one team.  This team includes your primary Cardiologist (physician) and Advanced Practice Providers or APPs (Physician Assistants and Nurse Practitioners) who all work together to provide you with the care you need, when you need it.  Your next appointment:   3 month(s)  Provider:   You may see Dr. Junnie Olives or one of the following Advanced Practice Providers on your designated Care Team:   Laneta Pintos, NP Gildardo Labrador, PA-C Varney Gentleman, PA-C Cadence Fitzhugh, PA-C Ronald Cockayne, NP Morey Ar, NP    We recommend signing up for the patient portal called MyChart.  Sign up information is provided on this After Visit Summary.  MyChart is used to connect with patients for Virtual Visits (Telemedicine).  Patients are able to view lab/test results, encounter notes, upcoming appointments, etc.  Non-urgent messages can be sent to your provider as well.   To learn more about what you can do with MyChart, go to [email or web address].

## 2024-05-05 LAB — BASIC METABOLIC PANEL WITH GFR
BUN/Creatinine Ratio: 10 (ref 9–20)
BUN: 8 mg/dL (ref 6–24)
CO2: 22 mmol/L (ref 20–29)
Calcium: 9.6 mg/dL (ref 8.7–10.2)
Chloride: 101 mmol/L (ref 96–106)
Creatinine, Ser: 0.84 mg/dL (ref 0.76–1.27)
Glucose: 111 mg/dL — ABNORMAL HIGH (ref 70–99)
Potassium: 4.1 mmol/L (ref 3.5–5.2)
Sodium: 141 mmol/L (ref 134–144)
eGFR: 106 mL/min/{1.73_m2} (ref >59)

## 2024-05-18 ENCOUNTER — Ambulatory Visit: Attending: Cardiology

## 2024-05-18 DIAGNOSIS — R072 Precordial pain: Secondary | ICD-10-CM | POA: Diagnosis not present

## 2024-05-18 LAB — ECHOCARDIOGRAM COMPLETE
AR max vel: 2.57 cm2
AV Area VTI: 2.51 cm2
AV Area mean vel: 2.44 cm2
AV Mean grad: 3 mmHg
AV Peak grad: 5.5 mmHg
Ao pk vel: 1.17 m/s
Area-P 1/2: 3.31 cm2
Calc EF: 65 %
S' Lateral: 2.6 cm
Single Plane A2C EF: 68.5 %
Single Plane A4C EF: 62.7 %

## 2024-05-20 ENCOUNTER — Ambulatory Visit: Payer: Self-pay | Admitting: Cardiology

## 2024-06-25 ENCOUNTER — Encounter (HOSPITAL_COMMUNITY): Payer: Self-pay

## 2024-06-27 ENCOUNTER — Telehealth (HOSPITAL_COMMUNITY): Payer: Self-pay | Admitting: *Deleted

## 2024-06-27 NOTE — Telephone Encounter (Signed)
 Attempted to call patient regarding upcoming cardiac CT appointment. Left message on voicemail with name and callback number  Larey Brick RN Navigator Cardiac Imaging [facility] [phone] Office [phone] Cell

## 2024-06-28 ENCOUNTER — Ambulatory Visit
Admission: RE | Admit: 2024-06-28 | Discharge: 2024-06-28 | Disposition: A | Discharge status: Home / Self Care | Source: Ambulatory Visit | Attending: Cardiology | Admitting: Cardiology

## 2024-06-28 DIAGNOSIS — R072 Precordial pain: Secondary | ICD-10-CM | POA: Insufficient documentation

## 2024-06-28 MED ORDER — ONDANSETRON HCL 4 MG/2ML IJ SOLN
4.0000 mg | Freq: Once | INTRAMUSCULAR | Status: AC
  Administered 2024-06-28: 4 mg via INTRAVENOUS
  Filled 2024-06-28: qty 2

## 2024-06-28 MED ORDER — ONDANSETRON HCL 4 MG/2ML IJ SOLN
INTRAMUSCULAR | Status: AC
  Filled 2024-06-28: qty 2

## 2024-06-28 MED ORDER — NITROGLYCERIN 0.4 MG SL SUBL
0.8000 mg | SUBLINGUAL_TABLET | Freq: Once | SUBLINGUAL | Status: AC
  Administered 2024-06-28: 0.8 mg via SUBLINGUAL
  Filled 2024-06-28: qty 25

## 2024-06-28 MED ORDER — IOHEXOL 350 MG/ML SOLN
100.0000 mL | Freq: Once | INTRAVENOUS | Status: AC | PRN
  Administered 2024-06-28: 100 mL via INTRAVENOUS

## 2024-06-28 MED ORDER — SODIUM CHLORIDE 0.9 % IV BOLUS
100.0000 mL | Freq: Once | INTRAVENOUS | Status: AC
  Administered 2024-06-28: 100 mL via INTRAVENOUS

## 2024-06-28 MED ORDER — NITROGLYCERIN 0.4 MG SL SUBL
SUBLINGUAL_TABLET | SUBLINGUAL | Status: AC
Start: 2024-06-28 — End: 2024-06-28
  Filled 2024-06-28: qty 1

## 2024-06-28 NOTE — Progress Notes (Signed)
 Pt removed from scanner, states not feeling well, reports that he is claustrophobic and asking to sit up, pt assisted to sit up and states that he is very dizzy, feels nauseated and just doesn't feel right, cool cloths applied to the pt's head and neck, pt beginning to profusely sweat and become pale,. Pt asked to lay down on the table, pt lying on his right side for comfort, fluid bolus started and zofran  given

## 2024-06-28 NOTE — Progress Notes (Signed)
 Spoke with Dr Budd Kindle regarding patient and Cardiac CT. Patient did not take any of his home medications except the premedication Metoprolol  100 mg. States does not do well in close spaces. During the the scan patient received Nitroglycerin  0.8mg  and at one point patient did not feel and and to be pulled out of the scanner. Patient became anxious, hyperventilating, and the need to sit up. Patient felt he could proceed however once laying down and about to enter CT scanner patient did not feel he could continue. Dr Budd Kindle cancelled the cardiac CT and consider alternate testing. Patient and wife verbalized understanding of plan of care.

## 2024-06-28 NOTE — Progress Notes (Signed)
 Pt on his back and attempting to slide pt closer to the machine to proceed and pt asking to stop, stating that he's feeling nervous again

## 2024-07-10 ENCOUNTER — Encounter
Admission: RE | Admit: 2024-07-10 | Discharge: 2024-07-10 | Disposition: A | Discharge status: Home / Self Care | Source: Ambulatory Visit | Attending: Cardiology | Admitting: Cardiology

## 2024-07-10 DIAGNOSIS — R072 Precordial pain: Secondary | ICD-10-CM | POA: Diagnosis present

## 2024-07-10 LAB — NM MYOCAR MULTI W/SPECT W/WALL MOTION / EF
LV dias vol: 108 mL (ref 62–150)
LV sys vol: 46 mL (ref 4.2–5.8)
Nuc Stress EF: 58 %
Peak HR: 126 {beats}/min
Percent HR: 74 %
Rest HR: 77 {beats}/min
Rest Nuclear Isotope Dose: 10.7 mCi
SDS: 0
SRS: 0
SSS: 1
ST Depression (mm): 0 mm
Stress Nuclear Isotope Dose: 32.5 mCi
TID: 0.87

## 2024-07-10 MED ORDER — REGADENOSON 0.4 MG/5ML IV SOLN
0.4000 mg | Freq: Once | INTRAVENOUS | Status: AC
  Administered 2024-07-10: 0.4 mg via INTRAVENOUS

## 2024-07-10 MED ORDER — TECHNETIUM TC 99M TETROFOSMIN IV KIT
32.5300 | PACK | Freq: Once | INTRAVENOUS | Status: AC | PRN
  Administered 2024-07-10: 32.53 via INTRAVENOUS

## 2024-07-10 MED ORDER — TECHNETIUM TC 99M TETROFOSMIN IV KIT
10.7400 | PACK | Freq: Once | INTRAVENOUS | Status: AC | PRN
  Administered 2024-07-10: 10.74 via INTRAVENOUS

## 2024-07-11 ENCOUNTER — Ambulatory Visit: Payer: Self-pay | Admitting: Cardiology

## 2024-08-02 ENCOUNTER — Other Ambulatory Visit (HOSPITAL_COMMUNITY)

## 2024-08-06 ENCOUNTER — Ambulatory Visit: Attending: Cardiology | Admitting: Cardiology

## 2024-08-06 ENCOUNTER — Encounter: Payer: Self-pay | Admitting: Cardiology

## 2024-08-06 VITALS — BP 130/82 | HR 97 | Ht 73.0 in | Wt 272.0 lb

## 2024-08-06 DIAGNOSIS — R072 Precordial pain: Secondary | ICD-10-CM

## 2024-08-06 NOTE — Patient Instructions (Signed)

## 2024-08-06 NOTE — Progress Notes (Signed)
 Cardiology Office Note:    Date:  08/06/2024   ID:  Ronald Ortega, DOB [birth year], MRN [id number]  PCP:  Freddrick, No   [facility] Providers Cardiologist:  None     Referring MD: No ref. provider found   Chief Complaint  Patient presents with   Follow-up    3 month follow up pt has been doing well with no complaints of chest pain, chest pressure or SOB, medciation reviewed verbally with patient    History of Present Illness:    Ronald Ortega is a 48 y.o. male with no known medical history presenting for follow-up.  Last seen with chest pain.  Chest pain was in the context of feeling anxious, several members of her wife side with having heart attacks and strokes.  He does not know much about his family history especially as mother was adopted.  He denies smoking.  Only has seasonal allergies.  Otherwise doing okay.  Denies any further episodes of chest discomfort.   Past Medical History:  Diagnosis Date   Allergy    GERD (gastroesophageal reflux disease)     Past Surgical History:  Procedure Laterality Date   CHOLECYSTECTOMY  1994   COLONOSCOPY WITH PROPOFOL  N/A 04/08/2022   Procedure: COLONOSCOPY WITH PROPOFOL ;  Surgeon: Unk Corinn Skiff, MD;  Location: [facility];  Service: Endoscopy;  Laterality: N/A;   POLYPECTOMY  04/08/2022   Procedure: POLYPECTOMY;  Surgeon: Unk Corinn Skiff, MD;  Location: [facility];  Service: Endoscopy;;   Reconstructive Tendons Right 1990   TONSILLECTOMY  1980    Current Medications: Current Meds  Medication Sig   cetirizine (ZYRTEC) 5 MG tablet Take 5 mg by mouth daily.   dextromethorphan-guaiFENesin (MUCINEX DM) 30-600 MG 12hr tablet Take 1 tablet by mouth 2 (two) times daily.   fluticasone  (FLONASE ) 50 MCG/ACT nasal spray SPRAY 2 SPRAYS INTO EACH NOSTRIL EVERY DAY   ibuprofen  (ADVIL ) 600 MG tablet Take 1 tablet (600 mg total) by mouth every 6 (six) hours as needed.   ipratropium (ATROVENT ) 0.06 %  nasal spray Place 2 sprays into both nostrils 4 (four) times daily.   metoprolol  tartrate (LOPRESSOR ) 100 MG tablet TAKE 1 TABLET 2 HR PRIOR TO CARDIAC PROCEDURE   omeprazole (PRILOSEC OTC) 20 MG tablet Take 20 mg by mouth daily. Patient states purchase medication OTC from [facility].     Allergies:   Patient has no known allergies.   Social History   Socioeconomic History   Marital status: Married    Spouse name: Not on file   Number of children: Not on file   Years of education: Not on file   Highest education level: Not on file  Occupational History   Not on file  Tobacco Use   Smoking status: Never   Smokeless tobacco: Never  Vaping Use   Vaping status: Never Used  Substance and Sexual Activity   Alcohol use: No   Drug use: No   Sexual activity: Not on file  Other Topics Concern   Not on file  Social History Narrative   Not on file   Social Drivers of Health   Financial Resource Strain: Not on file  Food Insecurity: Not on file  Transportation Needs: Not on file  Physical Activity: Not on file  Stress: Not on file  Social Connections: Not on file     Family History: The patient's family history includes Colon cancer in his father; Hypertension in his mother.  ROS:   Please  see the history of present illness.     All other systems reviewed and are negative.  EKGs/Labs/Other Studies Reviewed:    The following studies were reviewed today:       Recent Labs: 04/01/2024: ALT 41; Hemoglobin 14.0; Platelets 194 05/04/2024: BUN 8; Creatinine, Ser 0.84; Potassium 4.1; Sodium 141  Recent Lipid Panel    Component Value Date/Time   CHOL 167 03/15/2022 1448   TRIG 106 03/15/2022 1448   HDL 42 03/15/2022 1448   CHOLHDL 4.0 03/15/2022 1448   LDLCALC 106 (H) 03/15/2022 1448     Risk Assessment/Calculations:              Physical Exam:    VS:  BP 130/82 (BP Location: Left Arm, Patient Position: Sitting)   Pulse 97   Ht 6' 1 (1.854 m)   Wt 272 lb (123.4  kg)   SpO2 97%   BMI 35.89 kg/m     Wt Readings from Last 3 Encounters:  08/06/24 272 lb (123.4 kg)  05/04/24 276 lb (125.2 kg)  04/01/24 260 lb (117.9 kg)     GEN:  Well nourished, well developed in no acute distress HEENT: Normal NECK: No JVD; No carotid bruits CARDIAC: RRR, no murmurs, rubs, gallops RESPIRATORY:  Clear to auscultation without rales, wheezing or rhonchi  ABDOMEN: Soft, non-tender, non-distended MUSCULOSKELETAL:  No edema; No deformity  SKIN: Warm and dry NEUROLOGIC:  Alert and oriented x 3 PSYCHIATRIC:  Normal affect   ASSESSMENT:    1. Precordial pain    PLAN:    In order of problems listed above:  Chest pain, echo 6/25 EF 60 to 65%, Lexiscan  Myoview  showed no significant ischemia, low risk study.  Patient symptoms appear atypical possibly related with anxiety from members on her wife side having MIs and strokes.  He was made aware of results, reassured.  Follow-up as needed      Medication Adjustments/Labs and Tests Ordered: Current medicines are reviewed at length with the patient today.  Concerns regarding medicines are outlined above.  No orders of the defined types were placed in this encounter.  No orders of the defined types were placed in this encounter.   There are no Patient Instructions on file for this visit.   Signed, Redell Cave, MD  08/06/2024 9:13 AM    [facility]

## 2024-11-11 ENCOUNTER — Ambulatory Visit
Admission: EM | Admit: 2024-11-11 | Discharge: 2024-11-11 | Disposition: A | Discharge status: Home / Self Care | Attending: Physician Assistant | Admitting: Physician Assistant

## 2024-11-11 ENCOUNTER — Encounter: Payer: Self-pay | Admitting: Emergency Medicine

## 2024-11-11 DIAGNOSIS — M7989 Other specified soft tissue disorders: Secondary | ICD-10-CM

## 2024-11-11 DIAGNOSIS — L03113 Cellulitis of right upper limb: Secondary | ICD-10-CM | POA: Diagnosis not present

## 2024-11-11 MED ORDER — PREDNISONE 20 MG PO TABS: 40.0000 mg | ORAL_TABLET | Freq: Every day | ORAL | 0 refills | Status: AC

## 2024-11-11 MED ORDER — CEPHALEXIN 500 MG PO CAPS: 1000.0000 mg | ORAL_CAPSULE | Freq: Two times a day (BID) | ORAL | 0 refills | Status: AC

## 2024-11-11 NOTE — ED Provider Notes (Signed)
 " [facility]    CSN: [id number] Arrival date & time: 11/11/24  1230      History   Chief Complaint Chief Complaint  Patient presents with   Rash    HPI Ronald Ortega is a 48 y.o. male presenting for right upper arm redness, pain and swelling with associated rash for the past couple days.  He denies insect bite or sting.  He states the area was with generalized erythema and then he noticed a couple red bumps.  The area is sore even to light touch.  No fever, headaches, body aches or flulike symptoms.  Has been taking ibuprofen  and Tylenol  for pain.  HPI  Past Medical History:  Diagnosis Date   Allergy    GERD (gastroesophageal reflux disease)     Patient Active Problem List   Diagnosis Date Noted   Encounter for screening colonoscopy    Family history of colon cancer in father    Adenomatous polyp of colon    Allergic rhinitis 03/15/2022   Family history of colon cancer 03/15/2022    Past Surgical History:  Procedure Laterality Date   CHOLECYSTECTOMY  1994   COLONOSCOPY WITH PROPOFOL  N/A 04/08/2022   Procedure: COLONOSCOPY WITH PROPOFOL ;  Surgeon: Unk Corinn Skiff, MD;  Location: [facility];  Service: Endoscopy;  Laterality: N/A;   POLYPECTOMY  04/08/2022   Procedure: POLYPECTOMY;  Surgeon: Unk Corinn Skiff, MD;  Location: [facility];  Service: Endoscopy;;   Reconstructive Tendons Right 1990   TONSILLECTOMY  1980       Home Medications    Prior to Admission medications  Medication Sig Start Date End Date Taking? Authorizing Provider  cephALEXin  (KEFLEX ) 500 MG capsule Take 2 capsules (1,000 mg total) by mouth 2 (two) times daily for 7 days. 11/11/24 11/18/24 Yes Arvis Jolan NOVAK, PA-C  cetirizine (ZYRTEC) 5 MG tablet Take 5 mg by mouth daily.   Yes [provider]  fluticasone  (FLONASE ) 50 MCG/ACT nasal spray SPRAY 2 SPRAYS INTO EACH NOSTRIL EVERY DAY 11/09/22  Yes Drubel, Manuelita, PA-C  omeprazole (PRILOSEC  OTC) 20 MG tablet Take 20 mg by mouth daily. Patient states purchase medication OTC from [facility].   Yes [provider]  predniSONE  (DELTASONE ) 20 MG tablet Take 2 tablets (40 mg total) by mouth daily for 5 days. 11/11/24 11/16/24 Yes Arvis Jolan NOVAK, PA-C  dextromethorphan-guaiFENesin (MUCINEX DM) 30-600 MG 12hr tablet Take 1 tablet by mouth 2 (two) times daily.    [provider]  ibuprofen  (ADVIL ) 600 MG tablet Take 1 tablet (600 mg total) by mouth every 6 (six) hours as needed. 10/24/22   Brimage, Vondra, DO  ipratropium (ATROVENT ) 0.06 % nasal spray Place 2 sprays into both nostrils 4 (four) times daily. 09/25/23   Brimage, Vondra, DO  metoprolol  tartrate (LOPRESSOR ) 100 MG tablet TAKE 1 TABLET 2 HR PRIOR TO CARDIAC PROCEDURE 05/04/24   Darliss Rogue, MD    Family History Family History  Problem Relation Age of Onset   Hypertension Mother    Colon cancer Father     Social History Social History[1]   Allergies   Patient has no known allergies.   Review of Systems Review of Systems  Constitutional:  Negative for fatigue and fever.  Musculoskeletal:  Negative for arthralgias and joint swelling.  Skin:  Positive for color change and rash. Negative for wound.  Neurological:  Negative for weakness, numbness and headaches.     Physical Exam Triage Vital Signs ED Triage Vitals  Encounter Vitals Group     BP      Girls Systolic BP Percentile      Girls Diastolic BP Percentile      Boys Systolic BP Percentile      Boys Diastolic BP Percentile      Pulse      Resp      Temp      Temp src      SpO2      Weight      Height      Head Circumference      Peak Flow      Pain Score      Pain Loc      Pain Education      Exclude from Growth Chart    No data found.  Updated Vital Signs BP (!) 139/91 (BP Location: Left Arm)   Pulse 91   Temp 98.2 F (36.8 C) (Oral)   Resp 16   Ht 6' 1 (1.854 m)   Wt 272 lb 0.8 oz (123.4 kg)   SpO2 94%   BMI 35.89  kg/m    Physical Exam Vitals and nursing note reviewed.  Constitutional:      General: He is not in acute distress.    Appearance: Normal appearance. He is well-developed. He is not ill-appearing.  HENT:     Head: Normocephalic and atraumatic.  Eyes:     General: No scleral icterus.    Conjunctiva/sclera: Conjunctivae normal.  Cardiovascular:     Rate and Rhythm: Normal rate and regular rhythm.  Pulmonary:     Effort: Pulmonary effort is normal. No respiratory distress.     Breath sounds: Normal breath sounds.  Musculoskeletal:     Cervical back: Neck supple.  Skin:    General: Skin is warm and dry.     Capillary Refill: Capillary refill takes less than 2 seconds.     Findings: Erythema and rash present.  Neurological:     General: No focal deficit present.     Mental Status: He is alert. Mental status is at baseline.     Motor: No weakness.     Gait: Gait normal.  Psychiatric:        Mood and Affect: Mood normal.        Behavior: Behavior normal.     RIGHT UPPER ARM: There is erythema and swelling 11 x 14 cm with 3 darker erythematous papules. Tenderness throughout. Full ROM of shoulder, elbow.   UC Treatments / Results  Labs (all labs ordered are listed, but only abnormal results are displayed) Labs Reviewed - No data to display  EKG   Radiology No results found.  Procedures Procedures (including critical care time)  Medications Ordered in UC Medications - No data to display  Initial Impression / Assessment and Plan / UC Course  I have reviewed the triage vital signs and the nursing notes.  Pertinent labs & imaging results that were available during my care of the patient were reviewed by me and considered in my medical decision making (see chart for details).   48 year old male presents for rash and redness of the right upper arm x 2 days.  No fever or flulike symptoms.  No insect bites or stings.  See image included in chart.  Concern for cellulitis  versus localized allergic reaction.  Sent Keflex  and prednisone .  I outlined the area of erythema with a skin marker.  Advised him to return or go to ER if he develops  a fever, spreading redness, increased pain, redness or swelling is not improving in the next couple days.   Final Clinical Impressions(s) / UC Diagnoses   Final diagnoses:  Cellulitis of right upper arm  Swelling of arm     Discharge Instructions      - Rash is most consistent with an infection or allergic type reaction. - I sent an antibiotic and prednisone .  Also take antihistamine such as Benadryl apply ice to the area frequently. - May continue with Tylenol  as needed for pain. - If redness is spreading, fever or worsening symptoms seek immediate reevaluation either here or in the ER.     ED Prescriptions     Medication Sig Dispense Auth. Provider   cephALEXin  (KEFLEX ) 500 MG capsule Take 2 capsules (1,000 mg total) by mouth 2 (two) times daily for 7 days. 28 capsule Arvis Huxley B, PA-C   predniSONE  (DELTASONE ) 20 MG tablet Take 2 tablets (40 mg total) by mouth daily for 5 days. 10 tablet Liela Rylee B, PA-C      PDMP not reviewed this encounter.     [1]  Social History Tobacco Use   Smoking status: Never   Smokeless tobacco: Never  Vaping Use   Vaping status: Never Used  Substance Use Topics   Alcohol use: No   Drug use: No     Arvis Huxley KATHEE DEVONNA 11/11/24 1333  "

## 2024-11-11 NOTE — Discharge Instructions (Addendum)
-   Rash is most consistent with an infection or allergic type reaction. - I sent an antibiotic and prednisone .  Also take antihistamine such as Benadryl apply ice to the area frequently. - May continue with Tylenol  as needed for pain. - If redness is spreading, fever or worsening symptoms seek immediate reevaluation either here or in the ER.

## 2024-11-11 NOTE — ED Triage Notes (Signed)
 Pt has rash on his right underarm. Started about 2 days ago. He is concerned it is shingles.
